# Patient Record
Sex: Male | Born: 1970 | Hispanic: Yes | Marital: Married | Smoking: Former smoker
Health system: Southern US, Community
[De-identification: ages and names within clinical notes are randomized; demographics above are authoritative.]

## PROBLEM LIST (undated history)

## (undated) DIAGNOSIS — F329 Major depressive disorder, single episode, unspecified: Secondary | ICD-10-CM

## (undated) DIAGNOSIS — H269 Unspecified cataract: Secondary | ICD-10-CM

## (undated) DIAGNOSIS — E785 Hyperlipidemia, unspecified: Secondary | ICD-10-CM

## (undated) DIAGNOSIS — E119 Type 2 diabetes mellitus without complications: Secondary | ICD-10-CM

## (undated) DIAGNOSIS — K219 Gastro-esophageal reflux disease without esophagitis: Secondary | ICD-10-CM

## (undated) DIAGNOSIS — T7840XA Allergy, unspecified, initial encounter: Secondary | ICD-10-CM

## (undated) DIAGNOSIS — H5440 Blindness, one eye, unspecified eye: Secondary | ICD-10-CM

## (undated) DIAGNOSIS — F32A Depression, unspecified: Secondary | ICD-10-CM

## (undated) HISTORY — DX: Hyperlipidemia, unspecified: E78.5

## (undated) HISTORY — DX: Gastro-esophageal reflux disease without esophagitis: K21.9

## (undated) HISTORY — DX: Allergy, unspecified, initial encounter: T78.40XA

## (undated) HISTORY — DX: Type 2 diabetes mellitus without complications: E11.9

## (undated) HISTORY — DX: Depression, unspecified: F32.A

## (undated) HISTORY — DX: Unspecified cataract: H26.9

## (undated) HISTORY — PX: EYE SURGERY: SHX253

## (undated) HISTORY — DX: Blindness, one eye, unspecified eye: H54.40

---
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
## 1999-08-25 ENCOUNTER — Emergency Department (HOSPITAL_COMMUNITY): Admission: EM | Admit: 1999-08-25 | Discharge: 1999-08-25 | Payer: Self-pay

## 2002-08-23 ENCOUNTER — Emergency Department (HOSPITAL_COMMUNITY): Admission: EM | Admit: 2002-08-23 | Discharge: 2002-08-24 | Payer: Self-pay | Admitting: Emergency Medicine

## 2002-08-24 ENCOUNTER — Encounter: Payer: Self-pay | Admitting: Emergency Medicine

## 2007-09-18 ENCOUNTER — Emergency Department (HOSPITAL_COMMUNITY): Admission: EM | Admit: 2007-09-18 | Discharge: 2007-09-18 | Payer: Self-pay | Admitting: Family Medicine

## 2007-11-17 ENCOUNTER — Emergency Department (HOSPITAL_COMMUNITY): Admission: EM | Admit: 2007-11-17 | Discharge: 2007-11-17 | Payer: Self-pay | Admitting: Emergency Medicine

## 2008-12-18 ENCOUNTER — Ambulatory Visit: Payer: Self-pay | Admitting: Family Medicine

## 2008-12-18 ENCOUNTER — Encounter: Payer: Self-pay | Admitting: Family Medicine

## 2008-12-18 DIAGNOSIS — E1165 Type 2 diabetes mellitus with hyperglycemia: Secondary | ICD-10-CM

## 2008-12-18 DIAGNOSIS — E119 Type 2 diabetes mellitus without complications: Secondary | ICD-10-CM | POA: Insufficient documentation

## 2008-12-18 DIAGNOSIS — F1010 Alcohol abuse, uncomplicated: Secondary | ICD-10-CM | POA: Insufficient documentation

## 2008-12-18 DIAGNOSIS — H11009 Unspecified pterygium of unspecified eye: Secondary | ICD-10-CM | POA: Insufficient documentation

## 2008-12-18 DIAGNOSIS — F1210 Cannabis abuse, uncomplicated: Secondary | ICD-10-CM | POA: Insufficient documentation

## 2008-12-18 DIAGNOSIS — R809 Proteinuria, unspecified: Secondary | ICD-10-CM

## 2008-12-18 DIAGNOSIS — H5440 Blindness, one eye, unspecified eye: Secondary | ICD-10-CM

## 2008-12-18 DIAGNOSIS — R112 Nausea with vomiting, unspecified: Secondary | ICD-10-CM

## 2008-12-18 LAB — CONVERTED CEMR LAB
ALT: 17 units/L (ref 0–53)
AST: 18 units/L (ref 0–37)
Albumin: 4.7 g/dL (ref 3.5–5.2)
Alkaline Phosphatase: 65 units/L (ref 39–117)
BUN: 9 mg/dL (ref 6–23)
CO2: 20 meq/L (ref 19–32)
Calcium: 9.6 mg/dL (ref 8.4–10.5)
Chloride: 102 meq/L (ref 96–112)
Cholesterol: 177 mg/dL (ref 0–200)
Creatinine, Ser: 0.82 mg/dL (ref 0.40–1.50)
Glucose, Bld: 190 mg/dL — ABNORMAL HIGH (ref 70–99)
HDL: 41 mg/dL (ref >39)
Hgb A1c MFr Bld: 7.2 %
LDL Cholesterol: 95 mg/dL (ref 0–99)
Potassium: 4.3 meq/L (ref 3.5–5.3)
Sodium: 140 meq/L (ref 135–145)
Total Bilirubin: 0.6 mg/dL (ref 0.3–1.2)
Total CHOL/HDL Ratio: 4.3
Total Protein: 7.4 g/dL (ref 6.0–8.3)
Triglycerides: 203 mg/dL — ABNORMAL HIGH (ref <150)
VLDL: 41 mg/dL — ABNORMAL HIGH (ref 0–40)

## 2008-12-23 ENCOUNTER — Telehealth: Payer: Self-pay | Admitting: *Deleted

## 2008-12-23 ENCOUNTER — Encounter: Payer: Self-pay | Admitting: Family Medicine

## 2009-01-17 ENCOUNTER — Encounter (INDEPENDENT_AMBULATORY_CARE_PROVIDER_SITE_OTHER): Payer: Self-pay | Admitting: *Deleted

## 2009-01-17 DIAGNOSIS — F17200 Nicotine dependence, unspecified, uncomplicated: Secondary | ICD-10-CM | POA: Insufficient documentation

## 2009-03-09 ENCOUNTER — Ambulatory Visit: Payer: Self-pay | Admitting: Family Medicine

## 2009-08-05 ENCOUNTER — Emergency Department (HOSPITAL_COMMUNITY): Admission: EM | Admit: 2009-08-05 | Discharge: 2009-08-05 | Payer: Self-pay | Admitting: Family Medicine

## 2010-01-01 ENCOUNTER — Observation Stay (HOSPITAL_COMMUNITY): Admission: EM | Admit: 2010-01-01 | Discharge: 2010-01-02 | Payer: Self-pay | Admitting: Emergency Medicine

## 2010-01-01 ENCOUNTER — Emergency Department (HOSPITAL_COMMUNITY): Admission: EM | Admit: 2010-01-01 | Discharge: 2010-01-01 | Payer: Self-pay | Admitting: Family Medicine

## 2010-01-04 ENCOUNTER — Emergency Department (HOSPITAL_COMMUNITY): Admission: EM | Admit: 2010-01-04 | Discharge: 2010-01-04 | Payer: Self-pay | Admitting: Emergency Medicine

## 2010-04-06 NOTE — Letter (Signed)
Summary: Results Letter  [facility]  [address]   [place], [place] [address]   Phone: [phone]  Fax: [phone]    12/23/2008  James Pratt [address] [place], [place]  [address]  Dear Mr. Birt,  Your recent labs were normal with the exception of high glucose (sugar) and high triglycerides (this is also related to sugar). Your kidneys studies indicate that your diabetes is affecting them, so I would also like for you to start a medication called Lisinopril that will protect them from further damage. Because you have diabetes and high triglycerides, I would like for you to also take a baby aspirin each day to protect your heart. We can discuss the labs and address any questions that you may have at your next visit.   Your medications: 1)  Ranitidine Hcl 150 Mg Caps (Ranitidine Hcl) .... Two Times A Day To Protect Your Stomach 2)  Metformin Hcl 1000 Mg Tabs (Metformin Hcl) .... 1/2 To 1 By Mouth Daily For Your Diabetes 3)  Aspirin 81 Mg  Tbec (Aspirin) .... One By Mouth Every Day To Protect  Your Heart 4)  Lisinopril 10 Mg  Tabs (Lisinopril) .... Take 1/2  Tab By Mouth Daily To Protect Your Kidneys  Sincerely,   Helane Rima DO   Appended Document: Results Letter mailed.

## 2010-04-06 NOTE — Assessment & Plan Note (Signed)
Summary: pt left without being seen/TS   Allergies: 1)  ! * Aleve'   Complete Medication List: 1)  Ranitidine Hcl 150 Mg Caps (Ranitidine hcl) .... Two times a day to protect your stomach 2)  Metformin Hcl 1000 Mg Tabs (Metformin hcl) .... 1/2 to 1 by mouth daily for your diabetes 3)  Aspirin 81 Mg Tbec (Aspirin) .... One by mouth every day 4)  Lisinopril 10 Mg Tabs (Lisinopril) .... Take 1/2  tab by mouth daily  Other Orders: No Charge Patient Arrived (NCPA0) (NCPA0)

## 2010-04-06 NOTE — Assessment & Plan Note (Signed)
Summary: NP,tcb   Vital Signs:  Patient profile:   40 year old male Height:      67.5 inches Weight:      180.5 pounds BMI:     27.95 Pulse rate:   80 / minute BP sitting:   130 / 80  (left arm)  Vitals Entered By: Arlyss Repress CMA, (December 18, 2008 9:03 AM) CC: new pt. was dx with DM some years ago at [facility]. does not check BS or takes meds because can not afford it. Is Patient Diabetic? Yes  Pain Assessment Patient in pain? no        Primary Care Provider:  Helane Rima DO  CC:  new pt. was dx with DM some years ago at [facility]. does not check BS or takes meds because can not afford it.Marland Kitchen  History of Present Illness: 40 yo M presenting for a new patient visit.  1. DM: dx at [facility] one year ago. was Rx metformin but did not take because he could not afford the medication. bought natural things to help with DM including an aloe drink which he used until last month. states that his sugars went from  ~500 to low 100s. he used his wife's glucometer to check. denies CP, SOB, HA, numbness/tingling in hands/feet, lower extremity edema. endorses N/V, abdominal pain, polyuria, polydipsia. he tries to follow a diabetic diet. he does not exercise.  2. Marijuana: one daily x almost 20 years.  3. ETOH: 3-4 beer nearly every day. no history of withdrawal.  4. Nausea/Vomiting/Abdominal Pain: each morning the patient wakes with nausea and mild abdominal pain, he vomits small amount of bile, then goes outside to smoke marijuana, then feels better. he has no more problems for the rest of the day. he denies hematemesis, melena, hematochezia. no abnormal weight loss. no fever/chills.   5. 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    Habits & Providers  Alcohol-Tobacco-Diet     Alcohol drinks/day: 4     Alcohol Counseling: to decrease amount and/or frequency of alcohol intake     Alcohol type: beer     Feels need to cut down: yes     Feels annoyed by  complaints: no     Feels guilty re: drinking: no     Needs 'eye opener' in am: no     Tobacco Status: current  Exercise-Depression-Behavior     Does Patient Exercise: no     Exercise Counseling: to improve exercise regimen     Have you felt down or hopeless? yes     Have you felt little pleasure in things? yes     Depression Counseling: further diagnostic testing and/or other treatment is indicated     STD Risk: never     Drug Use: marijuanna     Seat Belt Use: always  Comments: smokes 'weed'  Current Medications (verified): 1)  Ranitidine Hcl 150 Mg Caps (Ranitidine Hcl) .... Two Times A Day To Protect Your Stomach 2)  Metformin Hcl 1000 Mg Tabs (Metformin Hcl) .... 1/2 To 1 By Mouth Daily For Your Diabetes 3)  Aspirin 81 Mg  Tbec (Aspirin) .... One By Mouth Every Day 4)  Lisinopril 10 Mg  Tabs (Lisinopril) .... Take 1/2  Tab By Mouth Daily  Allergies (verified): 1)  ! * Aleve'  Past History:  Past Medical History: DM ETOH Abuse MJ Abuse Pterygium Left Eye Legally Blind Right Eye  Past Surgical History: Reconstruction of Right Globe,  2009    -after removal of 1 inch wood splinter    -followed at [facility]    -now blind in right eye  Family History: Family History Diabetes 1st degree relative Family History Hypertension  Social History: Lives in [place] with his wife and 2 daughter. Unemployed. 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 Wife: Nike Southwell. Daughter: Demetra Shiner (born in 1999). Daughter: Parks Ranger (born in 2007). Completed 5-8 years of education. Smoking Status:  current Does Patient Exercise:  no Drug Use:  marijuanna Seat Belt Use:  always STD Risk:  never  Review of Systems General:  Denies chills, fever, malaise, and weakness. Eyes:  Complains of vision loss-1 eye. CV:  Denies chest pain or discomfort, palpitations, shortness of breath with exertion, swelling of feet, and swelling of hands. Resp:  Denies  cough, shortness of breath, and wheezing. GI:  Complains of diarrhea, nausea, and vomiting; denies abdominal pain, bloody stools, dark tarry stools, indigestion, and vomiting blood. GU:  Complains of urinary frequency; denies dysuria and hematuria. Endo:  Complains of excessive thirst and excessive urination; denies weight change.  Physical Exam  General:  Well-developed, well-nourished, in no acute distress; alert, appropriate and cooperative throughout examination. Vitals reviewed. Head:  normocephalic and atraumatic.   Eyes:  left eye with hazy cornea, right eye with pterygium. Mouth:  pharynx pink and moist.   Neck:  No deformities, masses, or tenderness noted. Lungs:  Normal respiratory effort, chest expands symmetrically. Lungs are clear to auscultation, no crackles or wheezes. Heart:  Normal rate and regular rhythm. S1 and S2 normal without gallop, murmur, click, rub or other extra sounds. Abdomen:  Bowel sounds positive, abdomen soft and without masses, organomegaly or hernias noted. Slightly tender to palpation epigastric region. Pulses:  2+ dp. Extremities:  No edema. Neurologic:  alert & oriented X3, cranial nerves II-XII intact, and strength normal in all extremities.   Skin:  Intact without suspicious lesions or rashes. Psych:  Oriented X3, memory intact for recent and remote, normally interactive, and not anxious appearing.     Impression & Recommendations:  Problem # 1:  DIABETES MELLITUS (ICD-250.00) Assessment New A1c 7.2. We discussed stopping the herbs/drinks that he has been taking since it isn't regulated by the FDA and we can't be sure of contaminants. Rx: metformin to start with 500 by mouth two times a day. He was asked to hold the prescription until labs known.  His updated medication list for this problem includes:    Metformin Hcl 1000 Mg Tabs (Metformin hcl) .Marland Kitchen... 1/2 to 1 by mouth daily for your diabetes    Aspirin 81 Mg Tbec (Aspirin) ..... One by mouth  every day    Lisinopril 10 Mg Tabs (Lisinopril) .Marland Kitchen... Take 1/2  tab by mouth daily  Orders: A1C-[facility] (45409) UA Microalbumin-[facility] (81191) Comp Met-[facility] ([id number]) Lipid-[facility] ([id number])  Problem # 2:  CANNABIS ABUSE (ICD-305.20) Assessment: New Morning nausea/vomiting/diarrhea may be caused by marijuana withdrawl since the patient has been smoking it nearly daily for almost 20 years. Discussed slowly weaning off. The goal for this month is to decrease amount smoked from 1 daily to 1/2 daily.   Problem # 3:  ALCOHOL ABUSE (ICD-305.00) Assessment: New Disussed decreasing to 2 beer/daily this month.  Orders: Comp Met-[facility] [id number]) Lipid-[facility] ([id number])  Problem # 4:  NAUSEA AND VOMITING (ICD-787.01) History suggestive of mild daily withdrawal. Will Rx: ranitidine. Discussed slow wean of marijuana and ETOH. Gave red  flags. Will monitor carefully.  Problem # 5:  PTERYGIUM (ICD-372.40) Assessment: New Has optho and appointment on Nov 6.  Problem # 6:  MICROALBUMINURIA (ICD-791.0) Assessment: New Needs ACE/ARB. Awaiting creatinine.  Problem # 7:  Preventive Health Care (ICD-V70.0) Patient was asked to f/u in one month for FULL PHYSICAL. Flu shot given today.  Problem # 8:  BLINDNESS, LEFT EYE (ICD-369.60) Assessment: New Has optho and appointment on Nov. 6.  Complete Medication List: 1)  Ranitidine Hcl 150 Mg Caps (Ranitidine hcl) .... Two times a day to protect your stomach 2)  Metformin Hcl 1000 Mg Tabs (Metformin hcl) .... 1/2 to 1 by mouth daily for your diabetes 3)  Aspirin 81 Mg Tbec (Aspirin) .... One by mouth every day 4)  Lisinopril 10 Mg Tabs (Lisinopril) .... Take 1/2  tab by mouth daily  Other Orders: Influenza Vaccine NON MCR ([id number])  Patient Instructions: 1)  It was nice to meet you today! 2)  We are going to check some lab work today and I will let you know the results when they are back. 3)  Try to cut down on your smoking each day. 4)  Schedule a  FULL PHYSICAL in one month. Prescriptions: LISINOPRIL 10 MG  TABS (LISINOPRIL) Take 1/2  tab by mouth daily  #30 x 3   Entered and Authorized by:   Helane Rima DO   Signed by:   Helane Rima DO on 12/18/2008   Method used:   Print then Give to Patient   RxID:   [id number] ASPIRIN 81 MG  TBEC (ASPIRIN) one by mouth every day  #90 x 3   Entered and Authorized by:   Helane Rima DO   Signed by:   Helane Rima DO on 12/18/2008   Method used:   Print then Give to Patient   RxID:   [id number] METFORMIN HCL 1000 MG TABS (METFORMIN HCL) 1/2 to 1 by mouth daily for your diabetes  #180 x 3   Entered and Authorized by:   Helane Rima DO   Signed by:   Helane Rima DO on 12/18/2008   Method used:   Print then Give to Patient   RxID:   [id number] RANITIDINE HCL 150 MG CAPS (RANITIDINE HCL) two times a day to protect your stomach  #60 x 1   Entered and Authorized by:   Helane Rima DO   Signed by:   Helane Rima DO on 12/18/2008   Method used:   Print then Give to Patient   RxID:   [id number]    Influenza Vaccine    Vaccine Type: Fluvax Non-MCR    Site: left deltoid    Mfr: GlaxoSmithKline    Dose: 0.5 ml    Route: IM    Given by: Arlyss Repress CMA,    Exp. Date: 09/03/2009    Lot #: [id number]    VIS given: 09/28/06 version given December 18, 2008.  Flu Vaccine Consent Questions    Do you have a history of severe allergic reactions to this vaccine? no    Any prior history of allergic reactions to egg and/or gelatin? no    Do you have a sensitivity to the preservative Thimersol? no    Do you have a past history of Guillan-Barre Syndrome? no    Do you currently have an acute febrile illness? no    Have you ever had a severe reaction to latex? no    Vaccine information given and explained to patient? yes  Laboratory  Results   Urine Tests  Date/Time Received: December 18, 2008 10:02 AM  Date/Time Reported: December 18, 2008 10:24 AM   Microalbumin  (urine): 2+ mg/L   Comments: ...............test performed by......Marland KitchenBonnie A. [place], [place] (ASCP)   Blood Tests   Date/Time Received: December 18, 2008 9:08 AM  Date/Time Reported: December 18, 2008 9:28 AM   HGBA1C: 7.2%   (Normal Range: Non-Diabetic - 3-6%   Control Diabetic - 6-8%)  Comments: ...........test performed by...........Marland KitchenTerese Door, CMA       Prevention & Chronic Care Immunizations   Influenza vaccine: Fluvax Non-MCR  (12/18/2008)    Tetanus booster: Not documented    Pneumococcal vaccine: Not documented  Other Screening   Smoking status: current  (12/18/2008)  Diabetes Mellitus   HgbA1C: 7.2  (12/18/2008)    Eye exam: Not documented   Diabetic eye exam action/deferral: Not indicated  (12/18/2008)    Foot exam: Not documented   High risk foot: Not documented   Foot care education: Not documented   Foot exam due: 03/20/2009    Urine microalbumin/creatinine ratio: Not documented    Diabetes flowsheet reviewed?: Yes   Progress toward A1C goal: Unchanged  Lipids   Total Cholesterol: Not documented   LDL: Not documented   LDL Direct: Not documented   HDL: Not documented   Triglycerides: Not documented  Self-Management Support :   Personal Goals (by the next clinic visit) :     Personal A1C goal: 6  (12/18/2008)     Personal blood pressure goal: 130/80  (12/18/2008)     Personal LDL goal: 100  (12/18/2008)    Patient will work on the following items until the next clinic visit to reach self-care goals:     Medications and monitoring: take my medicines every day, check my blood sugar, check my blood pressure, bring all of my medications to every visit  (12/18/2008)     Eating: drink diet soda or water instead of juice or soda, eat more vegetables, use fresh or frozen vegetables, eat foods that are low in salt, eat baked foods instead of fried foods, eat fruit for snacks and desserts, limit or avoid alcohol  (12/18/2008)     Activity: take a 30  minute walk every day, take the stairs instead of the elevator, park at the far end of the parking lot  (12/18/2008)    Diabetes self-management support: Written self-care plan  (12/18/2008)   Diabetes care plan printed   Appended Document: Orders Update    Clinical Lists Changes  Orders: Added new Test order of [facility]- New Level 4 ([address]) - Signed

## 2010-04-10 ENCOUNTER — Encounter: Payer: Self-pay | Admitting: *Deleted

## 2010-05-19 LAB — CBC
HCT: 41.3 % (ref 39.0–52.0)
MCH: 30.4 pg (ref 26.0–34.0)
MCV: 86 fL (ref 78.0–100.0)
Platelets: 151 10*3/uL (ref 150–400)
RDW: 12.3 % (ref 11.5–15.5)

## 2010-05-19 LAB — DIFFERENTIAL
Basophils Absolute: 0 10*3/uL (ref 0.0–0.1)
Eosinophils Absolute: 0.1 10*3/uL (ref 0.0–0.7)
Eosinophils Relative: 2 % (ref 0–5)
Monocytes Absolute: 0.9 10*3/uL (ref 0.1–1.0)

## 2010-05-19 LAB — GLUCOSE, CAPILLARY
Glucose-Capillary: 312 mg/dL — ABNORMAL HIGH (ref 70–99)
Glucose-Capillary: 342 mg/dL — ABNORMAL HIGH (ref 70–99)

## 2010-05-19 LAB — POCT I-STAT, CHEM 8
BUN: 12 mg/dL (ref 6–23)
Calcium, Ion: 1.22 mmol/L (ref 1.12–1.32)
Creatinine, Ser: 0.9 mg/dL (ref 0.4–1.5)
Glucose, Bld: 300 mg/dL — ABNORMAL HIGH (ref 70–99)
TCO2: 27 mmol/L (ref 0–100)

## 2010-05-24 LAB — POCT I-STAT, CHEM 8
Chloride: 100 mEq/L (ref 96–112)
Glucose, Bld: 273 mg/dL — ABNORMAL HIGH (ref 70–99)
HCT: 45 % (ref 39.0–52.0)
Hemoglobin: 15.3 g/dL (ref 13.0–17.0)
Potassium: 4.3 mEq/L (ref 3.5–5.1)
Sodium: 136 mEq/L (ref 135–145)

## 2010-12-02 LAB — DIFFERENTIAL
Basophils Absolute: 0
Eosinophils Relative: 1
Lymphocytes Relative: 31
Lymphs Abs: 2
Monocytes Absolute: 0.4
Monocytes Relative: 7
Neutro Abs: 3.9

## 2010-12-02 LAB — COMPREHENSIVE METABOLIC PANEL
ALT: 27
AST: 21
Albumin: 4.1
Alkaline Phosphatase: 76
BUN: 15
CO2: 28
Calcium: 9.4
Chloride: 102
Creatinine, Ser: 0.83
GFR calc Af Amer: >60
GFR calc non Af Amer: >60
Glucose, Bld: 255 — ABNORMAL HIGH
Potassium: 4.3
Sodium: 138
Total Bilirubin: 1.1
Total Protein: 7.4

## 2010-12-02 LAB — URINE MICROSCOPIC-ADD ON

## 2010-12-02 LAB — KETONES, QUALITATIVE: Acetone, Bld: NEGATIVE

## 2010-12-02 LAB — CBC
HCT: 46.6
Hemoglobin: 16.4
RBC: 5.39
RDW: 12.4
WBC: 6.5

## 2010-12-02 LAB — URINALYSIS, ROUTINE W REFLEX MICROSCOPIC
Bilirubin Urine: NEGATIVE
Hgb urine dipstick: NEGATIVE
Nitrite: NEGATIVE
Protein, ur: NEGATIVE
Urobilinogen, UA: 1

## 2010-12-02 LAB — POCT I-STAT, CHEM 8
BUN: 17
Calcium, Ion: 1.23
Chloride: 101
Creatinine, Ser: 0.8
Glucose, Bld: 255 — ABNORMAL HIGH
TCO2: 26

## 2010-12-02 LAB — POCT URINALYSIS DIP (DEVICE)
Glucose, UA: 500 — AB
Nitrite: NEGATIVE
pH: 6

## 2014-02-10 ENCOUNTER — Ambulatory Visit (INDEPENDENT_AMBULATORY_CARE_PROVIDER_SITE_OTHER): Payer: 59 | Admitting: Family Medicine

## 2014-02-10 ENCOUNTER — Encounter: Payer: Self-pay | Admitting: Family Medicine

## 2014-02-10 ENCOUNTER — Ambulatory Visit (INDEPENDENT_AMBULATORY_CARE_PROVIDER_SITE_OTHER): Payer: 59 | Admitting: *Deleted

## 2014-02-10 VITALS — BP 130/75 | HR 72 | Temp 98.1°F | Ht 68.0 in | Wt 184.0 lb

## 2014-02-10 DIAGNOSIS — E1142 Type 2 diabetes mellitus with diabetic polyneuropathy: Secondary | ICD-10-CM | POA: Diagnosis not present

## 2014-02-10 DIAGNOSIS — Z23 Encounter for immunization: Secondary | ICD-10-CM

## 2014-02-10 LAB — BASIC METABOLIC PANEL
BUN: 18 mg/dL (ref 6–23)
CALCIUM: 9.8 mg/dL (ref 8.4–10.5)
CO2: 26 mEq/L (ref 19–32)
CREATININE: 0.91 mg/dL (ref 0.50–1.35)
Chloride: 98 mEq/L (ref 96–112)
Glucose, Bld: 319 mg/dL — ABNORMAL HIGH (ref 70–99)
POTASSIUM: 4.4 meq/L (ref 3.5–5.3)
Sodium: 133 mEq/L — ABNORMAL LOW (ref 135–145)

## 2014-02-10 LAB — LIPID PANEL
CHOLESTEROL: 173 mg/dL (ref 0–200)
HDL: 36 mg/dL — ABNORMAL LOW (ref >39)
LDL Cholesterol: 81 mg/dL (ref 0–99)
TRIGLYCERIDES: 278 mg/dL — AB (ref <150)
Total CHOL/HDL Ratio: 4.8 Ratio
VLDL: 56 mg/dL — ABNORMAL HIGH (ref 0–40)

## 2014-02-10 LAB — POCT GLYCOSYLATED HEMOGLOBIN (HGB A1C): Hemoglobin A1C: 9.3

## 2014-02-10 MED ORDER — NAPROXEN 500 MG PO TABS: 500.0000 mg | ORAL_TABLET | Freq: Two times a day (BID) | ORAL | Status: DC | PRN

## 2014-02-10 MED ORDER — PRAVASTATIN SODIUM 20 MG PO TABS: 20.0000 mg | ORAL_TABLET | Freq: Every day | ORAL | Status: DC

## 2014-02-10 MED ORDER — GABAPENTIN 100 MG PO CAPS: 100.0000 mg | ORAL_CAPSULE | Freq: Every day | ORAL | Status: DC

## 2014-02-10 MED ORDER — METFORMIN HCL 500 MG PO TABS: 500.0000 mg | ORAL_TABLET | Freq: Two times a day (BID) | ORAL | Status: DC

## 2014-02-10 NOTE — Patient Instructions (Signed)
We will call with the results of your lab work.

## 2014-02-10 NOTE — Progress Notes (Signed)
   Subjective:    Patient ID: James Pratt, male    DOB: March 10, 1970, 43 y.o.   MRN: [id number]  HPI CHRONIC DIABETES  Disease Monitoring  Blood Sugar Ranges: 200s fasting and evening  Polyuria: no   Visual problems: no, blind in left eye   Medication Compliance: no, taking metformin am and glipizide pm but doubt even this is consistent  Medication Side Effects  Hypoglycemia: yes, when he takes both meds (61 lowest he recalls)   Preventitive Health Care  Eye Exam: not recently, going slow with new patient but will address next visit  Foot Exam: done today  Diet pattern: irregular, busy work schedule  Exercise: active job Occupational hygienist(plumber)    Review of Systems See HPI    Objective:   Physical Exam  Constitutional: He is oriented to person, place, and time. He appears well-developed and well-nourished. No distress.  HENT:  Head: Normocephalic and atraumatic.  Eyes: Conjunctivae are normal. Right eye exhibits no discharge. Left eye exhibits no discharge. No scleral icterus.  Neck: Normal range of motion. Neck supple. No thyromegaly present.  Cardiovascular: Normal rate, regular rhythm, normal heart sounds and intact distal pulses.   No murmur heard. Pulmonary/Chest: Effort normal and breath sounds normal. No respiratory distress. He has no wheezes.  Abdominal: Soft. Bowel sounds are normal. He exhibits no distension and no mass. There is no tenderness. There is no rebound and no guarding.  Lymphadenopathy:    He has no cervical adenopathy.  Neurological: He is alert and oriented to person, place, and time.  Skin: Skin is warm and dry. He is not diaphoretic.  Psychiatric: He has a normal mood and affect. His behavior is normal.  Nursing note and vitals reviewed.         Assessment & Plan:

## 2014-02-10 NOTE — Assessment & Plan Note (Addendum)
A1c 9.3 today. patient reports hypoglycemia with metformin and glypizide together so is taking each once a day, complaining of burning in feet at night, reports severe "bone pain" from lipitor in the past - foot exam today, normal sensation, add gabapentin qhs - metformin 500 bid, will certainly need more than this but given poor compliance and contentious relationship with last MD will take it slow - bmet today - trial of low dose pravastatin to assess tolerance, lipid panel today - f/u in 3 months

## 2014-02-12 ENCOUNTER — Telehealth: Payer: Self-pay | Admitting: *Deleted

## 2014-02-12 NOTE — Telephone Encounter (Signed)
Patient wife informed of message from MD, expressed understanding.

## 2014-02-12 NOTE — Telephone Encounter (Signed)
-----   Message from Abram SanderElena M Adamo, MD sent at 02/11/2014  8:55 AM EST ----- Please inform patient that his blood sugar and cholesterol levels are elevated as we expected but otherwise his lab results are normal. He should take the pravachol and metformin as prescribed and let us know if he has any trouble tolerating these medications. I want to see him back in 2-3 months to recheck his A1c.

## 2014-05-12 ENCOUNTER — Ambulatory Visit (INDEPENDENT_AMBULATORY_CARE_PROVIDER_SITE_OTHER): Payer: 59 | Admitting: Family Medicine

## 2014-05-12 ENCOUNTER — Encounter: Payer: Self-pay | Admitting: Family Medicine

## 2014-05-12 DIAGNOSIS — IMO0002 Reserved for concepts with insufficient information to code with codable children: Secondary | ICD-10-CM

## 2014-05-12 DIAGNOSIS — E1165 Type 2 diabetes mellitus with hyperglycemia: Secondary | ICD-10-CM

## 2014-05-12 DIAGNOSIS — E119 Type 2 diabetes mellitus without complications: Secondary | ICD-10-CM

## 2014-05-12 LAB — GLUCOSE, CAPILLARY: GLUCOSE-CAPILLARY: 392 mg/dL — AB (ref 70–99)

## 2014-05-12 LAB — POCT GLYCOSYLATED HEMOGLOBIN (HGB A1C): HEMOGLOBIN A1C: 11.2

## 2014-05-12 MED ORDER — SITAGLIPTIN PHOSPHATE 100 MG PO TABS: 100.0000 mg | ORAL_TABLET | Freq: Every day | ORAL | Status: DC

## 2014-05-12 MED ORDER — GLUCOSE BLOOD VI STRP: ORAL_STRIP | Status: DC

## 2014-05-12 MED ORDER — LINAGLIPTIN 5 MG PO TABS: 5.0000 mg | ORAL_TABLET | Freq: Every day | ORAL | Status: DC

## 2014-05-12 MED ORDER — ONETOUCH ULTRA 2 W/DEVICE KIT: PACK | Status: DC

## 2014-05-12 MED ORDER — ONETOUCH ULTRASOFT LANCETS MISC: Status: DC

## 2014-05-12 NOTE — Assessment & Plan Note (Signed)
Uncontrolled. A1c 11.2 today. I discussed therapeutic options with the patient including insulin. Patient declined injectables/insulin. Will titrate up metformin to 1000 mg twice a day. I also started patient on Januvia today. Follow up closely with Dr. Richarda BladeAdamo.

## 2014-05-12 NOTE — Patient Instructions (Signed)
Titrate your metformin up to 1000 mg twice daily.   Start Januvia 100 mg daily.  Follow up closely with Dr. Richarda BladeAdamo.  Take care  Dr. Adriana Simasook

## 2014-05-12 NOTE — Addendum Note (Signed)
Addended by: Tommie SamsOOK, Jemya Depierro G on: 05/12/2014 01:34 PM   Modules accepted: Orders, Medications

## 2014-05-12 NOTE — Progress Notes (Signed)
   Subjective:    Patient ID: Fabio NeighborsHeriberto R Tesler, male    DOB: May 16, 1970, 44 y.o.   MRN: [id number]  HPI 44 year old male with a history of type 2 diabetes presents for same day appointment with complaints of elevated blood sugar.  1) DM-2  CBG's - 180's - 500's.  Recently having significantly elevated CBG's due to prednisone (12 day dose pak for allergic reaction).  Medications - Metformin 500 BID.   Compliance - Yes.   Medication side effects  Hypoglycemia: no  Review of Systems Per HPI    Objective:   Physical Exam Filed Vitals:   05/12/14 0919  BP: 122/74  Pulse: 74  Temp: 98.4 F (36.9 C)   Exam: General: well appearing, NAD. Cardiovascular: RRR. No murmurs, rubs, or gallops. Respiratory: CTAB. No rales, rhonchi, or wheeze.    Assessment & Plan:  See Problem List

## 2014-06-25 ENCOUNTER — Ambulatory Visit (INDEPENDENT_AMBULATORY_CARE_PROVIDER_SITE_OTHER): Payer: 59 | Admitting: Family Medicine

## 2014-06-25 ENCOUNTER — Encounter: Payer: Self-pay | Admitting: Family Medicine

## 2014-06-25 VITALS — BP 117/75 | HR 72 | Temp 98.2°F | Ht 68.0 in | Wt 186.3 lb

## 2014-06-25 DIAGNOSIS — E1165 Type 2 diabetes mellitus with hyperglycemia: Secondary | ICD-10-CM | POA: Diagnosis not present

## 2014-06-25 DIAGNOSIS — B351 Tinea unguium: Secondary | ICD-10-CM | POA: Insufficient documentation

## 2014-06-25 DIAGNOSIS — E7800 Pure hypercholesterolemia, unspecified: Secondary | ICD-10-CM | POA: Insufficient documentation

## 2014-06-25 DIAGNOSIS — M1990 Unspecified osteoarthritis, unspecified site: Secondary | ICD-10-CM | POA: Diagnosis not present

## 2014-06-25 DIAGNOSIS — E118 Type 2 diabetes mellitus with unspecified complications: Secondary | ICD-10-CM | POA: Diagnosis not present

## 2014-06-25 DIAGNOSIS — IMO0002 Reserved for concepts with insufficient information to code with codable children: Secondary | ICD-10-CM

## 2014-06-25 LAB — POCT GLYCOSYLATED HEMOGLOBIN (HGB A1C): HEMOGLOBIN A1C: 10.7

## 2014-06-25 MED ORDER — GLIPIZIDE 5 MG PO TABS: 5.0000 mg | ORAL_TABLET | Freq: Every day | ORAL | Status: DC

## 2014-06-25 MED ORDER — TRAMADOL HCL 50 MG PO TABS: 50.0000 mg | ORAL_TABLET | Freq: Three times a day (TID) | ORAL | Status: DC | PRN

## 2014-06-25 MED ORDER — METFORMIN HCL 500 MG PO TABS: 1000.0000 mg | ORAL_TABLET | Freq: Two times a day (BID) | ORAL | Status: DC

## 2014-06-25 MED ORDER — TERBINAFINE HCL 250 MG PO TABS: 250.0000 mg | ORAL_TABLET | Freq: Every day | ORAL | Status: DC

## 2014-06-25 MED ORDER — ATORVASTATIN CALCIUM 10 MG PO TABS: 10.0000 mg | ORAL_TABLET | Freq: Every day | ORAL | Status: DC

## 2014-06-25 NOTE — Progress Notes (Signed)
   Subjective:    Patient ID: James Pratt, male    DOB: 08-16-1970, 44 y.o.   MRN: [id number]  HPI CHRONIC DIABETES  Disease Monitoring  Blood Sugar Ranges: 170-230  Polyuria: no   Visual problems: yes, occasional blurry vision, unsure how this correlates with his blood sugars   Medication Compliance: no, ran out 2 weeks ago  Medication Side Effects  Hypoglycemia: no   Preventitive Health Care  Eye Exam: Jan 2016  Foot Exam: Dec 2015  Diet pattern: high fat and sugar but trying to do better  Exercise: minimal, works in Holiday representativeconstruction     Review of Systems See HPI    Objective:   Physical Exam  Constitutional: He is oriented to person, place, and time. He appears well-developed and well-nourished. No distress.  HENT:  Head: Normocephalic and atraumatic.  Cardiovascular: Normal rate.   Pulmonary/Chest: Effort normal.  Abdominal: He exhibits no distension.  Musculoskeletal: Normal range of motion. He exhibits no edema or tenderness.       Feet:  Neurological: He is alert and oriented to person, place, and time.  Skin: Skin is warm and dry. He is not diaphoretic.  Psychiatric: He has a normal mood and affect. His behavior is normal.  Nursing note and vitals reviewed.         Assessment & Plan:

## 2014-06-25 NOTE — Assessment & Plan Note (Signed)
Severe onychomycosis of right great toe with separation at base of nail - terbinafine daily x12 weeks

## 2014-06-25 NOTE — Assessment & Plan Note (Signed)
Muscle aches/tenderness since starting pravastatin - switch to lipitor 10mg , will increase if able to tolerate

## 2014-06-25 NOTE — Assessment & Plan Note (Signed)
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 Tylenol does not help and NSAIDS make him cough up blood. - tramadol prn

## 2014-06-25 NOTE — Patient Instructions (Signed)
For your diabetes please continue taking metformin 2 tabs twice a day and add glipizide 1 tab at lunch time.  For your muscle aches I am changing your cholesterol medicine and giving you tramadol to take as needed when the pain is very bad.  For your toenail fungus you will need to take terbinafine 1 pill daily for 12 weeks.

## 2014-06-25 NOTE — Assessment & Plan Note (Addendum)
Mild improvement in A1c from 1.5 months ago, still uncontrolled at 10.7. No meds for the last 2 weeks because he ran out. - continue metformin 1g bid - stop linagliptin (cause severe headaches and hyperglycemia per patient) - restart glipizide 5mg  daily. - f/u in 3 months

## 2014-08-01 ENCOUNTER — Ambulatory Visit: Payer: 59 | Admitting: Family Medicine

## 2014-11-12 ENCOUNTER — Other Ambulatory Visit: Payer: Self-pay | Admitting: *Deleted

## 2014-11-12 DIAGNOSIS — E7800 Pure hypercholesterolemia, unspecified: Secondary | ICD-10-CM

## 2014-11-13 MED ORDER — ATORVASTATIN CALCIUM 10 MG PO TABS: 10.0000 mg | ORAL_TABLET | Freq: Every day | ORAL | Status: DC

## 2014-11-26 ENCOUNTER — Ambulatory Visit (INDEPENDENT_AMBULATORY_CARE_PROVIDER_SITE_OTHER): Payer: 59 | Admitting: Family Medicine

## 2014-11-26 ENCOUNTER — Encounter: Payer: Self-pay | Admitting: Family Medicine

## 2014-11-26 ENCOUNTER — Ambulatory Visit (HOSPITAL_COMMUNITY)
Admission: RE | Admit: 2014-11-26 | Discharge: 2014-11-26 | Disposition: A | Discharge status: Home / Self Care | Payer: 59 | Source: Ambulatory Visit | Attending: Family Medicine | Admitting: Family Medicine

## 2014-11-26 VITALS — BP 124/83 | HR 75 | Temp 98.3°F | Ht 68.0 in | Wt 182.1 lb

## 2014-11-26 DIAGNOSIS — M25562 Pain in left knee: Secondary | ICD-10-CM | POA: Diagnosis not present

## 2014-11-26 DIAGNOSIS — M76892 Other specified enthesopathies of left lower limb, excluding foot: Secondary | ICD-10-CM | POA: Insufficient documentation

## 2014-11-26 MED ORDER — MELOXICAM 15 MG PO TABS: 15.0000 mg | ORAL_TABLET | Freq: Every day | ORAL | Status: DC

## 2014-11-26 NOTE — Patient Instructions (Signed)
Thanks for coming in today.   We will get x-rays today. Start the mobic for your pain once daily. Avoid Goody's powder if possible. If you develop andy GI bleeding, then we need to know about it, and you should stop the mobic.   You will be called with the appointment for physical therapy and the MRI.   Follow up with Dr. Richarda Blade at your next appointment.   Thanks for letting us take care of you.   Sincerely,  Devota Pace, MD Family Medicine - PGY 2

## 2014-11-28 NOTE — Progress Notes (Signed)
Patient ID: James Pratt, male   DOB: [birth year], 44 y.o.   MRN: [id number]   [facility] Aquilla Hacker, MD Phone: [phone]  Subjective:   # Left Knee Pain - Pt. Here with left knee pain that has been going on for some time, but has been worse over the past 2-3 weeks. Unable to get in to see his PCP, so opted for same day clinic.  - He says his pain has been insidious in onset, but is worse in his left knee. 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  - He says that his knee rarely swells.  - he has been using nsaid's to control his knee pain even though he is not supposed to due to a previous ?GI bleed.  - he uses goody powders intermittently when it is severe, and this reduces his pain.  - he has never injured the knee in sports, mvc, or any other incident.  - he says that it hurts worse on the medial side.  - he feels catching sometimes.  - he says that it is definitely worse when he is on his knees at work.  - he has the hardest time in the morning, and if he rests for any period of time, getting going is painful for him.  - he has not noticed any giving way, warmth, redness. He has no history of gout.   All relevant systems were reviewed and were negative unless otherwise noted in the HPI  Past Medical History Reviewed problem list.  Medications- reviewed and updated Current Outpatient Prescriptions  Medication Sig Dispense Refill  . atorvastatin (LIPITOR) 10 MG tablet Take 1 tablet (10 mg total) by mouth daily. 30 tablet 3  . Blood Glucose Monitoring Suppl (ONE TOUCH ULTRA 2) W/DEVICE KIT Use as indicated to take blood sugar. 1 each 0  . gabapentin (NEURONTIN) 100 MG capsule Take 1 capsule (100 mg total) by mouth at bedtime. 30 capsule 11  . glipiZIDE (GLUCOTROL) 5 MG tablet Take 1 tablet (5 mg total) by mouth daily before lunch. 30 tablet 3  . glucose blood (ONE TOUCH ULTRA TEST) test strip Use as instructed 100 each 12  .  Lancets (ONETOUCH ULTRASOFT) lancets Use as instructed 100 each 12  . meloxicam (MOBIC) 15 MG tablet Take 1 tablet (15 mg total) by mouth daily. 30 tablet 3  . metFORMIN (GLUCOPHAGE) 500 MG tablet Take 2 tablets (1,000 mg total) by mouth 2 (two) times daily with a meal. 120 tablet 11  . terbinafine (LAMISIL) 250 MG tablet Take 1 tablet (250 mg total) by mouth daily. 30 tablet 2  . traMADol (ULTRAM) 50 MG tablet Take 1 tablet (50 mg total) by mouth every 8 (eight) hours as needed. 90 tablet 3   No current facility-administered medications for this visit.   Chief complaint-noted No additions to family history Social history- patient is a non smoker  Objective: BP 124/83 mmHg  Pulse 75  Temp(Src) 98.3 F (36.8 C) (Oral)  Ht _0  (1.727 m)  Wt 182 lb 1.6 oz (82.6 kg)  BMI 27.69 kg/m2 Gen: NAD, alert, cooperative with exam HEENT: NCAT, EOMI, PERRL, TMs nml Neck: FROM, supple CV: RRR, good S1/S2, no murmur, cap refill <3 Resp: CTABL, no wheezes, non-labored Abd: SNTND, BS present, no guarding or organomegaly Ext: No edema, warm, normal tone, moves UE/LE spontaneously Left Knee:  - medial joint line tenderness - no effusion - no  crepitus to exam.  - no laxity to varus / valgus stress - lachman / AP drawer negative.  - positive Thessalay test, and McMurray's for "catching".  Neuro: Alert and oriented, No gross deficits Skin: no rashes no lesions  Assessment/Plan:  # Left Knee Pain - pt. With ongoing left knee pain. Given his job, and the symptoms he complains of, may be some arthritis, but exam is consistent with possible meniscus tear. No previous films. Conservative management for now.  - Mobic once daily with the instruction that he should stop and let us know if he develops bleeding / gi symptoms. ? Hx of GI bleed.  - MRI of the knee given his exam, and plain films.  - PT referral.  - follow up in 1 months with PCP for improvement.  - If not improving, and if MRI positive for  meniscal injury as I suspect it may be, may require referral to ortho.

## 2014-12-01 ENCOUNTER — Encounter: Payer: Self-pay | Admitting: Physical Therapy

## 2014-12-01 ENCOUNTER — Ambulatory Visit: Payer: 59 | Attending: Family Medicine | Admitting: Physical Therapy

## 2014-12-01 DIAGNOSIS — M25562 Pain in left knee: Secondary | ICD-10-CM | POA: Diagnosis not present

## 2014-12-01 NOTE — Therapy (Signed)
[facility] [address] [place], [place], [address] Phone: [phone]   Fax:  [phone]  Physical Therapy Evaluation  Patient Details  Name: James Pratt MRN: [id number] Date of Birth: 1970-07-20 Referring Provider:  Yolande Jolly, MD  Encounter Date: 12/01/2014      PT End of Session - 12/01/14 0844    Visit Number 1   Number of Visits 12   Date for PT Re-Evaluation 01/12/15   PT Start Time 0800   PT Stop Time 0845   PT Time Calculation (min) 45 min   Activity Tolerance Patient tolerated treatment well   Behavior During Therapy [facility] for tasks assessed/performed      Past Medical History  Diagnosis Date  . Diabetes mellitus without complication     No past surgical history on file.  There were no vitals filed for this visit.  Visit Diagnosis:  Left knee pain - Plan: PT plan of care cert/re-cert      Subjective Assessment - 12/01/14 0812    Subjective pt is a 44 y.o M with CC of L knee pain that has been going on for a while, and has gradually worsened and the pain started insidiously and the pain stays around the knee cap. pt reports the knee feels unstedy. pt reports wearing knee pads constantly to keep it warm and to cushion in with kneeling.    Limitations Walking   How long can you sit comfortably? unlimited   How long can you stand comfortably? 5-10 min   How long can you walk comfortably? 5-10 min   Diagnostic tests 9/21//2016 Minimal tricompartmental spurring   Patient Stated Goals To be pain free, and to be able to progress on my own   Currently in Pain? Yes   Pain Score 4   walking up the steps 9/10   Pain Location Knee   Pain Orientation Left   Pain Descriptors / Indicators Aching;Dull   Pain Type Chronic pain   Pain Onset More than a month ago   Pain Frequency Constant   Aggravating Factors  stairs, prolonged waking   Pain Relieving Factors resting, pain medication             [facility] PT Assessment - 12/01/14 0819    Assessment   Medical Diagnosis Left knee pain    Onset Date/Surgical Date --  3 months   Hand Dominance Right   Next MD Visit make on as needed   Prior Therapy no   Precautions   Precautions None   Restrictions   Weight Bearing Restrictions No   Balance Screen   Has the patient fallen in the past 6 months No   Has the patient had a decrease in activity level because of a fear of falling?  No   Is the patient reluctant to leave their home because of a fear of falling?  No   Home Environment   Living Environment Private residence   Living Arrangements Spouse/significant other;Children   Available Help at Discharge Available PRN/intermittently;Available 24 hours/day   Type of Home House   Home Access Ramped entrance   Home Layout One level   Prior Function   Level of Independence Independent;Independent with basic ADLs   Vocation Full time employment  Plumbing   Vocation Requirements prolonged walking, standing, craweling,    Leisure hanging out with kids   Observation/Other Assessments   Focus on Therapeutic Outcomes (FOTO)  58% Limited  Predicted 37% limited   Posture/Postural  Control   Posture/Postural Control Postural limitations   Postural Limitations Rounded Shoulders;Forward head   ROM / Strength   AROM / PROM / Strength AROM;PROM;Strength   AROM   AROM Assessment Site Knee   Right/Left Knee Right;Left   Right Knee Extension 140   Right Knee Flexion 0   Left Knee Extension 0   Left Knee Flexion 135  tightness at endrange   PROM   Overall PROM  Within functional limits for tasks performed   PROM Assessment Site Knee   Strength   Strength Assessment Site Knee   Right/Left Knee Right;Left   Right Knee Flexion 5/5   Right Knee Extension 5/5   Left Knee Flexion 4+/5  pain during testing   Left Knee Extension 4+/5  pain during testing   Palpation   Patella mobility hypermobility of the patella on the L   Palpation  comment tenderness peripatellar with increased soreness in the medial apsect of the L knee in the medial patellar retinaculum   Special Tests    Special Tests Knee Special Tests   Knee Special tests  Lateral Pull Sign;Patellofemoral Apprehension Test;Step-up/Step Down Test;Patellofemoral Grind Test (Clarke's Sign)   Lateral Pull Sign    Findings Negative   Patellofemoral Apprehension Test    Findings Negative   Side  --  bil   Step-up/Step Down    Findings Positive   Side  Left   Patellofemoral Grind test (Clark's Sign)   Findings Negative   Ambulation/Gait   Gait Pattern Step-through pattern;Antalgic                           PT Education - 12/01/14 0844    Education provided Yes   Education Details evaluation findings, POC, Goals, HEP, knee anatomy education   Person(s) Educated Patient   Methods Explanation   Comprehension Verbalized understanding          PT Short Term Goals - 12/01/14 1132    PT SHORT TERM GOAL #1   Title pt will be I with initial HEP (12/22/2014)   Time 3   Period Weeks   Status New   PT SHORT TERM GOAL #2   Title pt will demonstrate techniques to control L knee pain and inflammation via RICE and HEP (12/22/2014)   Time 3   Period Weeks   Status New           PT Long Term Goals - 12/01/14 1133    PT LONG TERM GOAL #1   Title pt will be i with all HEP given through therapy (01/12/2015)   Time 6   Period Weeks   Status New   PT LONG TERM GOAL #2   Title pt will demonstrate L knee strength to >4+/5 with < 1/10 pain during testing to assist with work related standing endurance (01/12/2015)   Time 6   Period Weeks   Status New   PT LONG TERM GOAL #3   Title pt will be able to tolerate navigating up/down > 15 steps with < 2/10 pain to assist with job requirment of climbing and descending stairs (01/12/2015)   Time 6   Period Weeks   Status New   PT LONG TERM GOAL #4   Title pt will increase his FOTO score to > 63 to  demonstrate improved function at discharge (01/12/2015)   Time 6   Period Weeks   Status New  Plan - 12/01/14 0845    Clinical Impression Statement James Pratt presents to OPPT with CC of L knee pain that has been going on for the last couple of months that has gradually worsened. He demonstrates full AROM with tightness and pain noted at end range of flexion. MMT was [facility] but demontrated pain during testing beneath the knee cap. He exhibits a hypermobil patellar with pain noted peripatellar with signicant pointendnerness along the medial joint line/ patellar retinaculum. He demontsrates crepetus in the knee during deep knee bends. Pt would would benefit from physical therapy to decreased pain and return to his PLOF by addressing the impairments listed.    Pt will benefit from skilled therapeutic intervention in order to improve on the following deficits Decreased activity tolerance;Decreased balance;Decreased endurance;Pain;Improper body mechanics;Postural dysfunction;Increased muscle spasms;Difficulty walking;Hypermobility   Rehab Potential Good   PT Frequency 2x / week   PT Duration 6 weeks   PT Treatment/Interventions ADLs/Self Care Home Management;Cryotherapy;Electrical Stimulation;Iontophoresis /ml Dexamethasone;Moist Heat;Ultrasound;Gait training;Therapeutic activities;Therapeutic exercise;Patient/family education;Manual techniques;Passive range of motion;Dry needling;Taping   PT Next Visit Plan assess response to HEP, manual for muscle tightness, assess hip strength, modalities PRN   PT Home Exercise Plan SLR, hamstring/ hip flexor stretching, LAQ   Consulted and Agree with Plan of Care Patient         Problem List Patient Active Problem List   Diagnosis Date Noted  . Hypercholesteremia 06/25/2014  . Arthritis 06/25/2014  . Onychomycosis 06/25/2014  . TOBACCO USER 01/17/2009  . Diabetes mellitus type II, uncontrolled 12/18/2008  . ALCOHOL ABUSE 12/18/2008  .  CANNABIS ABUSE 12/18/2008  . BLINDNESS, LEFT EYE 12/18/2008  . PTERYGIUM 12/18/2008  . MICROALBUMINURIA 12/18/2008   Lulu Riding PT, DPT, LAT, ATC  12/01/2014  11:42 AM      [facility] [address] [place], [place], [address] Phone: [phone]   Fax:  [phone]

## 2014-12-01 NOTE — Patient Instructions (Signed)
   Kristoffer Leamon PT, DPT, LAT, ATC  [facility] Phone: [phone]     

## 2014-12-03 ENCOUNTER — Telehealth: Payer: Self-pay | Admitting: Family Medicine

## 2014-12-03 ENCOUNTER — Encounter (HOSPITAL_COMMUNITY): Payer: Self-pay | Admitting: *Deleted

## 2014-12-03 ENCOUNTER — Emergency Department (HOSPITAL_COMMUNITY): Payer: 59

## 2014-12-03 ENCOUNTER — Emergency Department (HOSPITAL_COMMUNITY)
Admission: EM | Admit: 2014-12-03 | Discharge: 2014-12-03 | Disposition: A | Discharge status: Home / Self Care | Payer: 59 | Attending: Emergency Medicine | Admitting: Emergency Medicine

## 2014-12-03 DIAGNOSIS — M542 Cervicalgia: Secondary | ICD-10-CM | POA: Diagnosis not present

## 2014-12-03 DIAGNOSIS — R51 Headache: Secondary | ICD-10-CM | POA: Insufficient documentation

## 2014-12-03 DIAGNOSIS — R1030 Lower abdominal pain, unspecified: Secondary | ICD-10-CM | POA: Insufficient documentation

## 2014-12-03 DIAGNOSIS — E119 Type 2 diabetes mellitus without complications: Secondary | ICD-10-CM | POA: Diagnosis not present

## 2014-12-03 DIAGNOSIS — Z791 Long term (current) use of non-steroidal anti-inflammatories (NSAID): Secondary | ICD-10-CM | POA: Diagnosis not present

## 2014-12-03 DIAGNOSIS — Z79899 Other long term (current) drug therapy: Secondary | ICD-10-CM | POA: Diagnosis not present

## 2014-12-03 DIAGNOSIS — R350 Frequency of micturition: Secondary | ICD-10-CM | POA: Diagnosis not present

## 2014-12-03 DIAGNOSIS — H538 Other visual disturbances: Secondary | ICD-10-CM | POA: Insufficient documentation

## 2014-12-03 DIAGNOSIS — R519 Headache, unspecified: Secondary | ICD-10-CM

## 2014-12-03 DIAGNOSIS — G8929 Other chronic pain: Secondary | ICD-10-CM | POA: Insufficient documentation

## 2014-12-03 LAB — CBC WITH DIFFERENTIAL/PLATELET
BASOS ABS: 0 10*3/uL (ref 0.0–0.1)
BASOS PCT: 0 %
EOS PCT: 2 %
Eosinophils Absolute: 0.1 10*3/uL (ref 0.0–0.7)
HCT: 40.6 % (ref 39.0–52.0)
Hemoglobin: 14.4 g/dL (ref 13.0–17.0)
LYMPHS PCT: 34 %
Lymphs Abs: 2 10*3/uL (ref 0.7–4.0)
MCH: 29.9 pg (ref 26.0–34.0)
MCHC: 35.5 g/dL (ref 30.0–36.0)
MCV: 84.2 fL (ref 78.0–100.0)
MONO ABS: 0.4 10*3/uL (ref 0.1–1.0)
Monocytes Relative: 7 %
Neutro Abs: 3.3 10*3/uL (ref 1.7–7.7)
Neutrophils Relative %: 57 %
PLATELETS: 136 10*3/uL — AB (ref 150–400)
RBC: 4.82 MIL/uL (ref 4.22–5.81)
RDW: 12.3 % (ref 11.5–15.5)
WBC: 5.9 10*3/uL (ref 4.0–10.5)

## 2014-12-03 LAB — COMPREHENSIVE METABOLIC PANEL
ALBUMIN: 3.8 g/dL (ref 3.5–5.0)
ALT: 18 U/L (ref 17–63)
ANION GAP: 7 (ref 5–15)
AST: 27 U/L (ref 15–41)
Alkaline Phosphatase: 71 U/L (ref 38–126)
BILIRUBIN TOTAL: 0.8 mg/dL (ref 0.3–1.2)
BUN: 12 mg/dL (ref 6–20)
CHLORIDE: 105 mmol/L (ref 101–111)
CO2: 24 mmol/L (ref 22–32)
Calcium: 9.2 mg/dL (ref 8.9–10.3)
Creatinine, Ser: 0.82 mg/dL (ref 0.61–1.24)
GFR calc Af Amer: >60 mL/min (ref >60)
GFR calc non Af Amer: >60 mL/min (ref >60)
GLUCOSE: 236 mg/dL — AB (ref 65–99)
POTASSIUM: 4.7 mmol/L (ref 3.5–5.1)
SODIUM: 136 mmol/L (ref 135–145)
TOTAL PROTEIN: 6.3 g/dL — AB (ref 6.5–8.1)

## 2014-12-03 LAB — URINE MICROSCOPIC-ADD ON

## 2014-12-03 LAB — URINALYSIS, ROUTINE W REFLEX MICROSCOPIC
BILIRUBIN URINE: NEGATIVE
Glucose, UA: >1000 mg/dL — AB
Hgb urine dipstick: NEGATIVE
KETONES UR: NEGATIVE mg/dL
LEUKOCYTES UA: NEGATIVE
NITRITE: NEGATIVE
PROTEIN: NEGATIVE mg/dL
Specific Gravity, Urine: 1.012 (ref 1.005–1.030)
UROBILINOGEN UA: 0.2 mg/dL (ref 0.0–1.0)
pH: 6 (ref 5.0–8.0)

## 2014-12-03 MED ORDER — DEXAMETHASONE SODIUM PHOSPHATE 4 MG/ML IJ SOLN
2.0000 mg | Freq: Once | INTRAMUSCULAR | Status: AC
  Administered 2014-12-03: 2 mg via INTRAVENOUS
  Filled 2014-12-03: qty 1

## 2014-12-03 MED ORDER — SODIUM CHLORIDE 0.9 % IV SOLN
1000.0000 mL | Freq: Once | INTRAVENOUS | Status: AC
  Administered 2014-12-03: 1000 mL via INTRAVENOUS

## 2014-12-03 MED ORDER — METOCLOPRAMIDE HCL 5 MG/ML IJ SOLN
5.0000 mg | Freq: Once | INTRAMUSCULAR | Status: AC
  Administered 2014-12-03: 5 mg via INTRAVENOUS
  Filled 2014-12-03: qty 2

## 2014-12-03 MED ORDER — DIPHENHYDRAMINE HCL 50 MG/ML IJ SOLN
50.0000 mg | Freq: Once | INTRAMUSCULAR | Status: AC
  Administered 2014-12-03: 50 mg via INTRAVENOUS
  Filled 2014-12-03: qty 1

## 2014-12-03 MED ORDER — SODIUM CHLORIDE 0.9 % IV SOLN: 1000.0000 mL | INTRAVENOUS | Status: DC

## 2014-12-03 NOTE — ED Notes (Signed)
Pt given urinal and informed that UA is needed.

## 2014-12-03 NOTE — Discharge Instructions (Signed)
General Headache Without Cause  A general headache is pain or discomfort felt around the head or neck area. The cause may not be found.   HOME CARE   · Keep all doctor visits.  · Only take medicines as told by your doctor.  · Lie down in a dark, quiet room when you have a headache.  · Keep a journal to find out if certain things bring on headaches. For example, write down:  ¨ What you eat and drink.  ¨ How much sleep you get.  ¨ Any change to your diet or medicines.  · Relax by getting a massage or doing other relaxing activities.  · Put ice or heat packs on the head and neck area as told by your doctor.  · Lessen stress.  · Sit up straight. Do not tighten (tense) your muscles.  · Quit smoking if you smoke.  · Lessen how much alcohol you drink.  · Lessen how much caffeine you drink, or stop drinking caffeine.  · Eat and sleep on a regular schedule.  · Get 7 to 9 hours of sleep, or as told by your doctor.  · Keep lights dim if bright lights bother you or make your headaches worse.  GET HELP RIGHT AWAY IF:   · Your headache becomes really bad.  · You have a fever.  · You have a stiff neck.  · You have trouble seeing.  · Your muscles are weak, or you lose muscle control.  · You lose your balance or have trouble walking.  · You feel like you will pass out (faint), or you pass out.  · You have really bad symptoms that are different than your first symptoms.  · You have problems with the medicines given to you by your doctor.  · Your medicines do not work.  · Your headache feels different than the other headaches.  · You feel sick to your stomach (nauseous) or throw up (vomit).  MAKE SURE YOU:   · Understand these instructions.  · Will watch your condition.  · Will get help right away if you are not doing well or get worse.  Document Released: 12/01/2007 Document Revised: 05/16/2011 Document Reviewed: 02/11/2011  ExitCare® Patient Information ©2015 [employer]. This information is not intended to replace advice given to  you by your health care provider. Make sure you discuss any questions you have with your health care provider.

## 2014-12-03 NOTE — ED Notes (Signed)
Pt reports headache x 2 weeks, more severe since Saturday. Reports possible blurred vision but denies n/v or sensitivity to light. No acute distress noted at triage.

## 2014-12-03 NOTE — ED Provider Notes (Signed)
CSN: [id number]     Arrival date & time 12/03/14  0919 History   First MD Initiated Contact with Patient 12/03/14 1102     Chief Complaint  Patient presents with  . Headache     (Consider location/radiation/quality/duration/timing/severity/associated sxs/prior Treatment) Patient is a 44 y.o. male presenting with headaches.  Headache Pain location:  Occipital (left occiptal that radiates to left eye) Quality:  Dull Radiates to:  Eyes Onset quality:  Sudden Duration:  6 weeks (intermittently for 6 weeks with increasing frequency and severity.  Current headache started suddenly on Saturday lying in bed. ) Timing:  Intermittent (but increasing infrequency) Progression:  Worsening Chronicity:  Chronic Similar to prior headaches: no   Context: not activity, not exposure to bright light, not loud noise and not straining   Relieved by:  Nothing Worsened by:  Nothing Ineffective treatments:  Acetaminophen, cold packs, aspirin and NSAIDs Associated symptoms: blurred vision and neck pain   Associated symptoms: no back pain, no cough, no dizziness, no eye pain, no fever, no focal weakness, no loss of balance, no nausea, no near-syncope, no neck stiffness, no numbness, no photophobia, no seizures, no syncope, no URI, no visual change, no vomiting and no weakness     Past Medical History  Diagnosis Date  . Diabetes mellitus without complication    History reviewed. No pertinent past surgical history. History reviewed. No pertinent family history. Social History  Substance Use Topics  . Smoking status: Never Smoker   . Smokeless tobacco: None  . Alcohol Use: No    Review of Systems  Constitutional: Negative for fever.  Eyes: Positive for blurred vision. Negative for photophobia and pain.  Respiratory: Negative for cough.   Cardiovascular: Negative for syncope and near-syncope.  Gastrointestinal: Negative for nausea and vomiting.  Genitourinary: Positive for frequency.   Musculoskeletal: Positive for neck pain. Negative for back pain and neck stiffness.  Neurological: Positive for headaches. Negative for dizziness, focal weakness, seizures, weakness, numbness and loss of balance.      Allergies  Review of patient's allergies indicates no known allergies.  Home Medications   Prior to Admission medications   Medication Sig Start Date End Date Taking? Authorizing Provider  acetaminophen (TYLENOL) 500 MG tablet Take 1,000 mg by mouth every 6 (six) hours as needed for moderate pain.   Yes Historical Provider, MD  atorvastatin (LIPITOR) 10 MG tablet Take 1 tablet (10 mg total) by mouth daily. 11/13/14  Yes Abram Sander, MD  Ibuprofen (ADVIL MIGRAINE) 200 MG CAPS Take 400 mg by mouth once.   Yes Historical Provider, MD  ibuprofen (ADVIL,MOTRIN) 200 MG tablet Take 800 mg by mouth every 6 (six) hours as needed for moderate pain.   Yes Historical Provider, MD  meloxicam (MOBIC) 15 MG tablet Take 1 tablet (15 mg total) by mouth daily. 11/26/14  Yes Yolande Jolly, MD  metFORMIN (GLUCOPHAGE) 500 MG tablet Take 2 tablets (1,000 mg total) by mouth 2 (two) times daily with a meal. Patient taking differently: Take 500 mg by mouth 2 (two) times daily with a meal.  06/25/14  Yes Abram Sander, MD  gabapentin (NEURONTIN) 100 MG capsule Take 1 capsule (100 mg total) by mouth at bedtime. Patient not taking: Reported on 12/01/2014 02/10/14   Abram Sander, MD  glipiZIDE (GLUCOTROL) 5 MG tablet Take 1 tablet (5 mg total) by mouth daily before lunch. Patient not taking: Reported on 12/01/2014 06/25/14   Abram Sander, MD  terbinafine (LAMISIL) 250 MG  tablet Take 1 tablet (250 mg total) by mouth daily. Patient not taking: Reported on 12/01/2014 06/25/14   Abram Sander, MD  traMADol (ULTRAM) 50 MG tablet Take 1 tablet (50 mg total) by mouth every 8 (eight) hours as needed. Patient not taking: Reported on 12/01/2014 06/25/14   Abram Sander, MD   BP 123/73 mmHg  Pulse 52  Temp(Src)  98.1 F (36.7 C) (Oral)  Resp 16  SpO2 99% Physical Exam  Constitutional: He is oriented to person, place, and time. He appears well-developed and well-nourished.  HENT:  Head: Normocephalic and atraumatic.  Mouth/Throat: Oropharynx is clear and moist.  No nuchal rigidity.  Eyes: Pupils are equal, round, and reactive to light.  Neck: Normal range of motion. Neck supple.  Cardiovascular: Normal rate, regular rhythm and normal heart sounds.   No murmur heard. Pulmonary/Chest: Effort normal and breath sounds normal. No respiratory distress. He has no wheezes. He has no rales.  Abdominal: Soft. Bowel sounds are normal. He exhibits no distension. There is tenderness in the suprapubic area. There is no rigidity, no rebound and no guarding.  Musculoskeletal: Normal range of motion.  Lymphadenopathy:    He has no cervical adenopathy.  Neurological: He is alert and oriented to person, place, and time.  Mental Status:   AOx3 Cranial Nerves:  I-not tested  II-PERRLA  III, IV, VI-EOMs intact  V-temporal and masseter strength intact  VII-symmetrical facial movements intact, no facial droop  VIII-hearing grossly intact bilaterally  IX, X-gag intact  XI-strength of sternomastoid and trapezius muscles 5/5  XII-tongue midline Motor:   Good muscle bulk and tone  Strength 5/5 bilaterally in upper and lower extremities   Cerebellar--RAMs, finger to nose intact  Casual and tandem gait normal without ataxia  No pronator drift Sensory:  Intact in upper and lower extremities      Skin: Skin is warm and dry.  Psychiatric: He has a normal mood and affect. His behavior is normal.    ED Course  Procedures (including critical care time) Labs Review Labs Reviewed  CBC WITH DIFFERENTIAL/PLATELET - Abnormal; Notable for the following:    Platelets 136 (*)    All other components within normal limits  URINALYSIS, ROUTINE W REFLEX MICROSCOPIC (NOT AT [facility]) - Abnormal; Notable for the following:     Glucose, UA >1000 (*)    All other components within normal limits  COMPREHENSIVE METABOLIC PANEL - Abnormal; Notable for the following:    Glucose, Bld 236 (*)    Total Protein 6.3 (*)    All other components within normal limits  URINE MICROSCOPIC-ADD ON    Imaging Review Ct Head Wo Contrast  12/03/2014   CLINICAL DATA:  Headache for 2 weeks which has worsened over the past 5 days. Blurred vision. Initial encounter.  EXAM: CT HEAD WITHOUT CONTRAST  TECHNIQUE: Contiguous axial images were obtained from the base of the skull through the vertex without intravenous contrast.  COMPARISON:  None.  FINDINGS: The brain appears normal without hemorrhage, infarct, mass lesion, mass effect, midline shift or abnormal extra-axial fluid collection. There is no hydrocephalus or pneumocephalus. The calvarium is intact. Imaged paranasal sinuses and mastoid air cells are clear. Remote right nasal bone fracture noted.  IMPRESSION: No acute intracranial abnormality.  Remote right nasal bone fracture.   Electronically Signed   By: Drusilla Kanner M.D.   On: 12/03/2014 12:12   I have personally reviewed and evaluated these images and lab results as part of my medical  decision-making.   EKG Interpretation None      MDM   Final diagnoses:  Nonintractable headache, unspecified chronicity pattern, unspecified headache type    Patient with no prior headache history presents today with 6 week history of headaches that have gradually gotten worse and increased in frequency. Latest headache beginning suddenly on Saturday. No relief with OTC treatments. Patient appears in no acute distress, nontoxic and VSS. On exam, there are no focal neurological findings. No nuchal rigidity. Labs pending. Will start IV fluids and treat with migraine cocktail and reassess.  Upon reassessment, patient's headache has improved.  Head CT shows no acute intracranial abnormalities.  CBC unremarkable.  CMP shows elevated glucose,  patient has history of DM.  UA pending.  UA shows glycosuria, consistent with DM.  Suggests better glycemic control.  HA resolution with migraine cocktail.  Doubt meningitis. Doubt SAH.  Doubt mass lesion.  Doubt infection.  Doubt metabolic derangement.  VSS.  Pt stable for discharge.  Discussed return precautions.  Patient agrees and acknowledges the above plan for discharge.  PCP follow up for headache management and glycemic control.  Case has been discussed with and seen by Dr. Jeraldine Loots who agrees with the above plan for discharge.      Cheri Fowler, PA-C 12/03/14 1325  Gerhard Munch, MD 12/03/14 [phone]

## 2014-12-03 NOTE — Telephone Encounter (Signed)
Got in touch with them, they will follow up as needed. Getting MRI.   CGM MD

## 2014-12-03 NOTE — Telephone Encounter (Signed)
Attempted to call pt. With normal knee x-ray results. No answer. Please call to let her know the x-ray was normal, and to follow up as specified in our office visit. Thanks  CGM MD

## 2014-12-04 ENCOUNTER — Ambulatory Visit: Payer: 59 | Admitting: Physical Therapy

## 2014-12-04 DIAGNOSIS — M25562 Pain in left knee: Secondary | ICD-10-CM | POA: Diagnosis not present

## 2014-12-04 NOTE — Patient Instructions (Signed)
Remove tape if irritating 

## 2014-12-04 NOTE — Therapy (Addendum)
[place], [place], [address] Phone: [phone]   Fax:  [phone]  Physical Therapy Treatment  Patient Details  Name: James Pratt MRN: [id number] Date of Birth: [birth year] Referring Provider:  Frazier Richards, MD  Encounter Date: 12/04/2014      PT End of Session - 12/04/14 1635    Visit Number 2   Number of Visits 12   Date for PT Re-Evaluation 01/12/15   PT Start Time 4742   PT Stop Time 1630   PT Time Calculation (min) 45 min   Activity Tolerance Patient tolerated treatment well;No increased pain   Behavior During Therapy [facility] for tasks assessed/performed      Past Medical History  Diagnosis Date  . Diabetes mellitus without complication     No past surgical history on file.  There were no vitals filed for this visit.  Visit Diagnosis:  Left knee pain      Subjective Assessment - 12/04/14 1556    Subjective Stairs and walking in the parkinglot painful .  5/10 now  Does his exercises every other day.    Pain gets worse climbing stairs .  Better with knee pads , heat helps.                         [facility] PT Treatment/Exercise - 12/04/14 1605    Knee/Hip Exercises: Stretches   Passive Hamstring Stretch 3 reps;30 seconds  strap   Hip Flexor Stretch 3 reps;30 seconds   Gastroc Stretch 3 reps;30 seconds   Knee/Hip Exercises: Standing   Heel Raises 10 reps;2 sets   Wall Squat 10 reps   Wall Squat Limitations cues topress heels vs toes.    Knee/Hip Exercises: Seated   Long Arc Quad 10 reps;3 sets   Knee/Hip Exercises: Supine   Straight Leg Raises 10 reps;3 sets  cues   Manual Therapy   Manual therapy comments Kinesiotex taping to activate quads and inhibit lower leg.  LT                  PT Short Term Goals - 12/04/14 1643    PT SHORT TERM GOAL #1   Title pt will be I with initial HEP (12/22/2014)   Time 3   Period Weeks   Status On-going    PT SHORT TERM GOAL #2   Title pt will demonstrate techniques to control L knee pain and inflammation via RICE and HEP (12/22/2014)   Time 3   Period Weeks   Status On-going           PT Long Term Goals - 12/04/14 1642    PT LONG TERM GOAL #1   Title pt will be i with all HEP given through therapy (01/12/2015)   Baseline independent with exercises so far   Time 6   Period Weeks   Status On-going   PT LONG TERM GOAL #2   Title pt will demonstrate L knee strength to >4+/5 with < 1/10 pain during testing to assist with work related standing endurance (01/12/2015)   Baseline 5+/10 pain with work activities, strength not tested   Time 6   Period Weeks   Status On-going   PT LONG TERM GOAL #3   Title pt will be able to tolerate navigating up/down > 15 steps with < 2/10 pain to assist with job requirment of climbing and descending stairs (01/12/2015)   Time 6  Period Weeks   Status On-going   PT LONG TERM GOAL #4   Title pt will increase his FOTO score to > 63 to demonstrate improved function at discharge (01/12/2015)   Time 6   Period Weeks   Status Unable to assess               Plan - 12/04/14 1636    Clinical Impression Statement tape helpful.  patient doing his exercises with correct technique.  hip flexors and hamstrings are WNL LT   PT Next Visit Plan assess hip strength.  manual for muscle tightness, progress home  exercises for hip if weak.  RICE INFO.   PT Home Exercise Plan SLR, hamstring/ hip flexor stretching, LAQ, nothing added today.     Consulted and Agree with Plan of Care Patient        Problem List Patient Active Problem List   Diagnosis Date Noted  . Hypercholesteremia 06/25/2014  . Arthritis 06/25/2014  . Onychomycosis 06/25/2014  . TOBACCO USER 01/17/2009  . Diabetes mellitus type II, uncontrolled 12/18/2008  . ALCOHOL ABUSE 12/18/2008  . CANNABIS ABUSE 12/18/2008  . BLINDNESS, LEFT EYE 12/18/2008  . PTERYGIUM 12/18/2008  .  MICROALBUMINURIA 12/18/2008    HARRIS,KAREN 12/04/2014, 4:45 PM  [facility] [address] [place], [place], [address] Phone: [phone]   Fax:  [phone]     Karen Harris, PTA 12/04/2014 4:45 PM Phone: [phone] Fax: [phone]             PHYSICAL THERAPY DISCHARGE SUMMARY  Visits from Start of Care: 2  Current functional level related to goals / functional outcomes:  FOTO 42% limited   Remaining deficits: See goals   Education / Equipment: HEP Plan:                                                    Patient goals were not met. Patient is being discharged due to not returning since the last visit.  ?????        Kristoffer Leamon PT, DPT, LAT, ATC  12/22/2014  8:47 AM      

## 2014-12-05 NOTE — Telephone Encounter (Signed)
Will inform patient of normal results on Monday when in clinic with pcp. Jazmin Hartsell,CMA

## 2014-12-08 ENCOUNTER — Ambulatory Visit: Payer: 59

## 2014-12-08 ENCOUNTER — Ambulatory Visit: Payer: 59 | Admitting: Family Medicine

## 2014-12-08 ENCOUNTER — Telehealth: Payer: Self-pay | Admitting: Family Medicine

## 2014-12-08 NOTE — Telephone Encounter (Signed)
Informed of x-ray results. Getting MRI.   CGM MD

## 2014-12-15 ENCOUNTER — Ambulatory Visit: Payer: 59 | Attending: Family Medicine | Admitting: Physical Therapy

## 2014-12-16 ENCOUNTER — Telehealth: Payer: Self-pay | Admitting: Physical Therapy

## 2014-12-16 NOTE — Telephone Encounter (Signed)
Attempted to call patient regarding missed appointment yesterday at 3:45 as well as to confirm his next appointment at 7:30 am tomorrow. No answer and no voicemail. Will cancel future appointments after second no show.

## 2014-12-17 ENCOUNTER — Ambulatory Visit: Payer: 59 | Admitting: Physical Therapy

## 2014-12-22 ENCOUNTER — Ambulatory Visit: Payer: 59 | Admitting: Physical Therapy

## 2014-12-25 ENCOUNTER — Encounter: Payer: 59 | Admitting: Physical Therapy

## 2015-04-02 ENCOUNTER — Ambulatory Visit (INDEPENDENT_AMBULATORY_CARE_PROVIDER_SITE_OTHER): Payer: 59 | Admitting: *Deleted

## 2015-04-02 DIAGNOSIS — Z23 Encounter for immunization: Secondary | ICD-10-CM

## 2015-04-20 ENCOUNTER — Other Ambulatory Visit: Payer: Self-pay | Admitting: Family Medicine

## 2015-04-20 DIAGNOSIS — E785 Hyperlipidemia, unspecified: Secondary | ICD-10-CM

## 2015-07-27 ENCOUNTER — Encounter: Payer: Self-pay | Admitting: Family Medicine

## 2015-07-27 ENCOUNTER — Ambulatory Visit (INDEPENDENT_AMBULATORY_CARE_PROVIDER_SITE_OTHER): Payer: 59 | Admitting: Family Medicine

## 2015-07-27 VITALS — BP 121/75 | HR 67 | Temp 98.0°F | Ht 68.0 in | Wt 164.8 lb

## 2015-07-27 DIAGNOSIS — E785 Hyperlipidemia, unspecified: Secondary | ICD-10-CM

## 2015-07-27 DIAGNOSIS — E1165 Type 2 diabetes mellitus with hyperglycemia: Secondary | ICD-10-CM | POA: Diagnosis not present

## 2015-07-27 DIAGNOSIS — IMO0001 Reserved for inherently not codable concepts without codable children: Secondary | ICD-10-CM

## 2015-07-27 LAB — POCT GLYCOSYLATED HEMOGLOBIN (HGB A1C): Hemoglobin A1C: 11.5

## 2015-07-27 MED ORDER — METFORMIN HCL 500 MG PO TABS: 1000.0000 mg | ORAL_TABLET | Freq: Two times a day (BID) | ORAL | Status: DC

## 2015-07-27 MED ORDER — ATORVASTATIN CALCIUM 10 MG PO TABS
ORAL_TABLET | ORAL | Status: DC
Start: 2015-07-27 — End: 2016-06-15

## 2015-07-27 NOTE — Patient Instructions (Signed)
Please increase your metformin to 1000mg  (2 pills) twice a day for a total of 2000mg  per day.

## 2015-07-29 NOTE — Assessment & Plan Note (Signed)
Uncontrolled, A1c 11.5, taking only 500 metformin bid because of "hypoglycemia" but not actually checking sugars. Refuses any injectable. True lows of 40-60 on glipizide - increase metformin to 1000mg  bid, f/u in 1 month to discuss other options and assess progress - will start checking glucose at least once a day and bring log to next visit - pt says he will not take any injectable no matter what due to severe needle phobia

## 2015-07-29 NOTE — Progress Notes (Signed)
   Subjective:    Patient ID: James Pratt, male    DOB: 1970/12/20, 45 y.o.   MRN: [id number]  HPI CHRONIC DIABETES  Disease Monitoring  Blood Sugar Ranges: does not check  Polyuria: no   Visual problems: yes, is blind in L eye   Medication Compliance: no, only taking 500 bid metformin Medication Side Effects  Hypoglycemia: 40-60 when taking glipizide, now he thinks he's low if he takes 1000mg  metformin but he doesn't actually check  Preventitive Health Care  Eye Exam: Jan 2016  Foot Exam: Dec 2015  Diet pattern: high fat and sugar but trying to do better  Exercise: minimal, works in [employer] representative[employer]     Review of Systems See HPI    Objective:   Physical Exam  Constitutional: He is oriented to person, place, and time. He appears well-developed and well-nourished. No distress.  HENT:  Head: Normocephalic and atraumatic.  Cardiovascular: Normal rate.   Pulmonary/Chest: Effort normal.  Abdominal: He exhibits no distension.  Musculoskeletal: Normal range of motion. He exhibits no edema or tenderness.       Feet:  Neurological: He is alert and oriented to person, place, and time.  Skin: Skin is warm and dry. He is not diaphoretic.  Psychiatric: He has a normal mood and affect. His behavior is normal.  Nursing note and vitals reviewed.         Assessment & Plan:  Diabetes mellitus type II, uncontrolled Uncontrolled, A1c 11.5, taking only 500 metformin bid because of "hypoglycemia" but not actually checking sugars. Refuses any injectable. True lows of 40-60 on glipizide - increase metformin to 1000mg  bid, f/u in 1 month to discuss other options and assess progress - will start checking glucose at least once a day and bring log to next visit - pt says he will not take any injectable no matter what due to severe needle phobia

## 2016-03-08 DIAGNOSIS — Z23 Encounter for immunization: Secondary | ICD-10-CM | POA: Diagnosis not present

## 2016-06-15 ENCOUNTER — Encounter: Payer: Self-pay | Admitting: Family Medicine

## 2016-06-15 ENCOUNTER — Ambulatory Visit (INDEPENDENT_AMBULATORY_CARE_PROVIDER_SITE_OTHER): Payer: 59 | Admitting: Family Medicine

## 2016-06-15 VITALS — BP 118/76 | HR 74 | Temp 98.3°F | Ht 68.0 in | Wt 177.0 lb

## 2016-06-15 DIAGNOSIS — E784 Other hyperlipidemia: Secondary | ICD-10-CM | POA: Diagnosis not present

## 2016-06-15 DIAGNOSIS — IMO0001 Reserved for inherently not codable concepts without codable children: Secondary | ICD-10-CM

## 2016-06-15 DIAGNOSIS — E1165 Type 2 diabetes mellitus with hyperglycemia: Secondary | ICD-10-CM

## 2016-06-15 DIAGNOSIS — E7849 Other hyperlipidemia: Secondary | ICD-10-CM

## 2016-06-15 LAB — POCT GLYCOSYLATED HEMOGLOBIN (HGB A1C): HEMOGLOBIN A1C: 10.6

## 2016-06-15 MED ORDER — ATORVASTATIN CALCIUM 10 MG PO TABS
ORAL_TABLET | ORAL | 11 refills | Status: DC
Start: 2016-06-15 — End: 2019-03-22

## 2016-06-15 MED ORDER — METFORMIN HCL 500 MG PO TABS: 1000.0000 mg | ORAL_TABLET | Freq: Two times a day (BID) | ORAL | 11 refills | Status: DC

## 2016-06-15 NOTE — Progress Notes (Signed)
1521

## 2016-06-15 NOTE — Patient Instructions (Addendum)
It was nice meeting you in clinic today! You were seen for diabetes follow up and for a physical.  Please make sure to take your Metformin everyday as prescribed.  I have refilled this as well as your Lipitor and they are available for pickup at your pharmacy.  As we discussed, you should try to moderate the amount of carbohydrates you eat and opt for other healthier food choices such as lean meats and vegetables.  You can follow up in 3 months for recheck of your A1c.    Please call clinic with questions.   Be well,  Freddrick March, MD

## 2016-06-15 NOTE — Progress Notes (Signed)
   Subjective:   Patient ID: James Pratt    DOB: 03/19/1970, 46 y.o. male   MRN: [id number]  CC: medication refill, DM f/u  HPI: James Pratt is a 46 y.o. male who presents to clinic today for DM follow up and medication refills.    T2DM Feels he is doing well.  Checks sugars at home every now and then. Does not keep a record of home CBGs.  Usually ~200s.  Reports good compliance with medications.  Is on Metformin 1000 mg BID, Lipitor 10 mg daily.  Both refilled today.  Diet- eats a lot of fruits.  Diet consists mainly of [place] food, salsa, refried beans, tortillas, cream of potatos, mac and cheese, pork chops.  Drinks plenty of water.  Exercise- working outside, lot of labor work.    ROS: Denies fevers, chills, nausea, vomiting, diarrhea.  Denies shortness of breath, abdominal pain, dizziness.   Patient is a current smoker.  Smokes 1 cigar every other day.  Medications reviewed. Objective:   BP 118/76   Pulse 74   Temp 98.3 F (36.8 C) (Oral)   Ht _0  (1.727 m)   Wt 177 lb (80.3 kg)   SpO2 98%   BMI 26.91 kg/m  Vitals and nursing note reviewed.  General: 46 yo M in NAD HEENT:NCAT, MMM, o/p clear Neck: supple CV: RRR no MRG  Lungs: CTA B/L with comfortable work of breathing  Abdomen: soft, NTND, +bs Skin: warm, dry Extremities: no edema or tenderness  Assessment & Plan:   Diabetes mellitus type II, uncontrolled Uncontrolled, A1c 10.6 today.  Reports good compliance on Metformin 1000 mg BID,  refilled today.   -Discussed healthy eating and eating carbs in moderation.   -Advised him to check sugars at least once a day at home and keep a record -Will monitor in 3 months.  Patient does not want any injectable insulin at all.   Orders Placed This Encounter  Procedures  . POCT glycosylated hemoglobin (Hb A1C)   Meds ordered this encounter  Medications  . atorvastatin (LIPITOR) 10 MG tablet    Sig: TAKE ONE TABLET EACH DAY    Dispense:  30 tablet   Refill:  11  . metFORMIN (GLUCOPHAGE) 500 MG tablet    Sig: Take 2 tablets (1,000 mg total) by mouth 2 (two) times daily with a meal.    Dispense:  120 tablet    Refill:  11    Lovenia Kim, MD [place], PGY-1 06/19/2016 7:15 PM

## 2016-06-19 NOTE — Assessment & Plan Note (Signed)
Uncontrolled, A1c 10.6 today.  Reports good compliance on Metformin 1000 mg BID,  refilled today.   -Discussed healthy eating and eating carbs in moderation.   -Advised him to check sugars at least once a day at home and keep a record -Will monitor in 3 months.  Patient does not want any injectable insulin at all.

## 2016-09-30 ENCOUNTER — Ambulatory Visit (INDEPENDENT_AMBULATORY_CARE_PROVIDER_SITE_OTHER): Payer: 59 | Admitting: Internal Medicine

## 2016-09-30 VITALS — Temp 98.1°F | Wt 177.0 lb

## 2016-09-30 DIAGNOSIS — I951 Orthostatic hypotension: Secondary | ICD-10-CM | POA: Diagnosis not present

## 2016-09-30 DIAGNOSIS — G629 Polyneuropathy, unspecified: Secondary | ICD-10-CM | POA: Insufficient documentation

## 2016-09-30 DIAGNOSIS — IMO0001 Reserved for inherently not codable concepts without codable children: Secondary | ICD-10-CM

## 2016-09-30 DIAGNOSIS — E1165 Type 2 diabetes mellitus with hyperglycemia: Secondary | ICD-10-CM | POA: Diagnosis not present

## 2016-09-30 LAB — POCT GLYCOSYLATED HEMOGLOBIN (HGB A1C): HEMOGLOBIN A1C: 9.6

## 2016-09-30 LAB — GLUCOSE, POCT (MANUAL RESULT ENTRY): POC Glucose: 200 mg/dl — AB (ref 70–99)

## 2016-09-30 MED ORDER — SITAGLIPTIN PHOSPHATE 50 MG PO TABS: 50.0000 mg | ORAL_TABLET | Freq: Every day | ORAL | 0 refills | Status: DC

## 2016-09-30 MED ORDER — PREGABALIN 75 MG PO CAPS: 75.0000 mg | ORAL_CAPSULE | Freq: Two times a day (BID) | ORAL | 0 refills | Status: DC

## 2016-09-30 MED ORDER — GLUCOSE BLOOD VI STRP: ORAL_STRIP | 12 refills | Status: DC

## 2016-09-30 NOTE — Patient Instructions (Addendum)
Make an appointment to see Dr. Raymondo BandKoval diabetes. I want to start on Januvia. I will prescribe you  pregabalin for your diabetic neuropathy. Please make sure to drink enough fluids.

## 2016-09-30 NOTE — Progress Notes (Signed)
   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 and then he gets up, he feels really dizzy  - Has been occurring for about a week and half - Has not had an recent changes in medication  - Patient blood sugars have been elevated  - More tired lately, having significant headaches   - Patient has been peeing a lot, more than regular - Patient has been drinking plenty of fluid  - Denies palpations, chest pain, SOB, no lose of conicous, no focal weakness or focal sensation changes - No vomiting up blood, no blood in stools  - No nausea or vomitting   Diabetes - Last A1c 11.5 - Only on metformin 1000 mg BID  - Glipizide in the past with significant hypoglycemia in the 40s in the past. Not willing to check CBGs therefore glipizide was stopped - Has not tried any other oral diabetic medications  - Endorse polyuria - Has not seen opthalmology due to expense associated with it  - Indicates burning sensation in feet and lower legs bilaterally - not improved by gabapentin   Past Medical History Reviewed problem list.  Medications- reviewed and updated No additions to family history Social history- patient is a non- smoker  Objective: Temp 98.1 F (36.7 C) (Oral)   Wt 177 lb (80.3 kg)   BMI 26.91 kg/m  Gen: NAD, alert, cooperative with exam Cardio: regular rate and rhythm, S1S2 heard, no murmurs appreciated Pulm: clear to auscultation bilaterally, no wheezes, rhonchi or rales Neurologic exam : Cn 2-7 intact Strength equal & normal in upper & lower extremities Able to walk on heels and toes.   Balance normal  Ffinger to nose wnl  Assessment/Plan: See problem based a/p  Orthostatic hypotension Patient's current episodes of positional dizziness most consistent with orthostatic hypotension. Blood pressures on evaluation positive  for orthostatics with a 20 point decrease in blood pressure from laying to standing. Patient with several years of uncontrolled diabetes mellitus, patient notes polyneuropathy- therefore orthostatic hypotension likely due to autonomic neuropathy.  Patient also has a history of severe dehydration and polyuria associated with elevated blood sugars. -Discussed that patient needs to better control his blood sugars in order to help with his orthostatic hypotension -Maintain hydration with both Gatorade and water - CBC with Differential/Platelet - Basic metabolic panel - POCT HgB A1C (CPT 83036) - Glucose (CBG)   Diabetes mellitus type II, uncontrolled A1C 9.6  Continue metformin Will add Januvia 50 mg daily  Would like to see Dr. Raymondo BandKoval, has a lot of respect for him given he is helped with his wife's diabetes. Will refer patient to pharmacy  Follow up with PCP   Polyneuropathy No improvement with gabapentin  Would like to try Lyrica

## 2016-10-01 LAB — CBC WITH DIFFERENTIAL/PLATELET
Basophils Absolute: 0 10*3/uL (ref 0.0–0.2)
Basos: 0 %
EOS (ABSOLUTE): 0.1 10*3/uL (ref 0.0–0.4)
Eos: 2 %
HEMOGLOBIN: 13.5 g/dL (ref 13.0–17.7)
Hematocrit: 40.7 % (ref 37.5–51.0)
IMMATURE GRANS (ABS): 0 10*3/uL (ref 0.0–0.1)
IMMATURE GRANULOCYTES: 0 %
LYMPHS ABS: 2.5 10*3/uL (ref 0.7–3.1)
LYMPHS: 37 %
MCH: 29.2 pg (ref 26.6–33.0)
MCHC: 33.2 g/dL (ref 31.5–35.7)
MCV: 88 fL (ref 79–97)
Monocytes Absolute: 0.6 10*3/uL (ref 0.1–0.9)
Monocytes: 9 %
NEUTROS ABS: 3.4 10*3/uL (ref 1.4–7.0)
Neutrophils: 52 %
PLATELETS: 157 10*3/uL (ref 150–379)
RBC: 4.63 x10E6/uL (ref 4.14–5.80)
RDW: 13.3 % (ref 12.3–15.4)
WBC: 6.6 10*3/uL (ref 3.4–10.8)

## 2016-10-01 LAB — BASIC METABOLIC PANEL
BUN/Creatinine Ratio: 14 (ref 9–20)
BUN: 16 mg/dL (ref 6–24)
CALCIUM: 10.1 mg/dL (ref 8.7–10.2)
CHLORIDE: 99 mmol/L (ref 96–106)
CO2: 25 mmol/L (ref 20–29)
Creatinine, Ser: 1.13 mg/dL (ref 0.76–1.27)
GFR calc non Af Amer: 77 mL/min/{1.73_m2} (ref >59)
GFR, EST AFRICAN AMERICAN: 89 mL/min/{1.73_m2} (ref >59)
GLUCOSE: 180 mg/dL — AB (ref 65–99)
Potassium: 4.9 mmol/L (ref 3.5–5.2)
Sodium: 139 mmol/L (ref 134–144)

## 2016-10-03 DIAGNOSIS — I951 Orthostatic hypotension: Secondary | ICD-10-CM

## 2016-10-03 HISTORY — DX: Orthostatic hypotension: I95.1

## 2016-10-03 NOTE — Assessment & Plan Note (Signed)
A1C 9.6  Continue metformin Will add Januvia 50 mg daily  Would like to see Dr. Raymondo BandKoval, has a lot of respect for him given he is helped with his wife's diabetes. Will refer patient to pharmacy  Follow up with PCP

## 2016-10-03 NOTE — Assessment & Plan Note (Signed)
No improvement with gabapentin  Would like to try Lyrica

## 2016-10-03 NOTE — Assessment & Plan Note (Addendum)
Patient's current episodes of positional dizziness most consistent with orthostatic hypotension. Blood pressures on evaluation positive for orthostatics with a 20 point decrease in blood pressure from laying to standing. Patient with several years of uncontrolled diabetes mellitus, patient notes polyneuropathy- therefore orthostatic hypotension likely due to autonomic neuropathy.  Patient also has a history of severe dehydration and polyuria associated with elevated blood sugars. -Discussed that patient needs to better control his blood sugars in order to help with his orthostatic hypotension -Maintain hydration with both Gatorade and water - CBC with Differential/Platelet - Basic metabolic panel - POCT HgB A1C (CPT 83036) - Glucose (CBG)

## 2016-10-06 ENCOUNTER — Ambulatory Visit (INDEPENDENT_AMBULATORY_CARE_PROVIDER_SITE_OTHER): Payer: 59 | Admitting: Pharmacist

## 2016-10-06 ENCOUNTER — Encounter: Payer: Self-pay | Admitting: Internal Medicine

## 2016-10-06 ENCOUNTER — Other Ambulatory Visit: Payer: Self-pay

## 2016-10-06 ENCOUNTER — Encounter: Payer: Self-pay | Admitting: Pharmacist

## 2016-10-06 DIAGNOSIS — E1165 Type 2 diabetes mellitus with hyperglycemia: Secondary | ICD-10-CM

## 2016-10-06 DIAGNOSIS — F17200 Nicotine dependence, unspecified, uncomplicated: Secondary | ICD-10-CM

## 2016-10-06 DIAGNOSIS — IMO0001 Reserved for inherently not codable concepts without codable children: Secondary | ICD-10-CM

## 2016-10-06 DIAGNOSIS — E7849 Other hyperlipidemia: Secondary | ICD-10-CM

## 2016-10-06 DIAGNOSIS — E784 Other hyperlipidemia: Secondary | ICD-10-CM | POA: Diagnosis not present

## 2016-10-06 DIAGNOSIS — E7800 Pure hypercholesterolemia, unspecified: Secondary | ICD-10-CM

## 2016-10-06 MED ORDER — DULAGLUTIDE 0.75 MG/0.5ML ~~LOC~~ SOAJ: 0.7500 mg | SUBCUTANEOUS | 1 refills | Status: DC

## 2016-10-06 MED ORDER — ATORVASTATIN CALCIUM 40 MG PO TABS: 40.0000 mg | ORAL_TABLET | Freq: Every day | ORAL | 3 refills | Status: DC

## 2016-10-06 NOTE — Patient Instructions (Addendum)
Thanks for coming to see us today! Here are the medication changes we talked about today:  1. Start Trulicity 0.75 mg injection once daily. Pick a day of the week, and be sure to take this medication on the same day every week.  2. Increase your atorvastatin from 10 mg to 40 mg once daily. You can pick up your atorvastatin 40mg  prescription at the pharmacy.  2. Do not pick up your Januvia.   3. You can pick up Lyrica and your OneTouch test strips at the pharmacy, and continue checking your blood sugar at least twice daily. Please bring your blood sugar log to next visit.  Please come back and see us in 1 month.

## 2016-10-06 NOTE — Assessment & Plan Note (Signed)
Diabetes longstanding currently uncontrolled with last A1C 9.6% (7/27). Patient reports hypoglycemic events and is able to verbalize appropriate hypoglycemia management plan. Reports adherence with medication, though he was unable to pick up his Januvia from pharmacy on 09/30/16 due to delay in prior authorization. Has been checking his blood sugars on his wife's glucometer because he does not have test strips for his own OneTouch glucometer. Has a fear of needles, and is hesitant to start insulin injections.  -Start dulaglutide (Trulicity) 0.75 mg injection once weekly. Consider increasing to 1.5 mg injection once weekly at next visit. -Do not pick up Januvia from the pharmacy -OneTouch test strips sent to pharmacy -Next A1C anticipated in 2-3 months.

## 2016-10-06 NOTE — Progress Notes (Signed)
    S:     Chief Complaint  Patient presents with  . Medication Management    diabetes    Patient arrives ambulating without assistance and with wife (in wheelchair) and daughter. Patient's wife is well known to pharmacy clinic.   Presents for diabetes evaluation, education, and management at the request of Dr. Cathlean CowerMikell. Patient was referred on 09/30/16.  Patient was last seen by Primary Care Provider on 09/30/16.  Patient reports Diabetes was diagnosed in 2005-2006.  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 Prescribed sitagliptin (Januvia) 50 mg once daily and pregabalin (Lyrica) 75mg  BID by Dr. Cathlean CowerMikell on 09/30/16, but has not picked up from pharmacy due to delay in prior authorization. Current diabetes medications include: metformin 1000 mg BID.  Patient reports some hypoglycemic events, in which he eats crackers and candies.  Patient reported dietary habits: Eats 2-3 meals/day. Patient has been cutting down on sugary drinks, and have started drinking diet soda and unsweetened tea.   O:  Physical Exam  Constitutional: He appears well-developed and well-nourished.   Review of Systems  All other systems reviewed and are negative.  Lab Results  Component Value Date   HGBA1C 9.6 09/30/2016   Vitals:   10/06/16 1556  BP: 117/72  Pulse: 72   Home fasting CBG: 187-237 per wife  10 year ASCVD risk: 11.7%  A/P: Diabetes longstanding currently uncontrolled with last A1C 9.6% (7/27). Patient reports hypoglycemic events and is able to verbalize appropriate hypoglycemia management plan. Reports adherence with medication, though he was unable to pick up his Januvia from pharmacy on 09/30/16 due to delay in prior authorization. Has been checking his blood sugars on his wife's glucometer because he does not have test strips for his own OneTouch glucometer. Has a fear of needles, and is hesitant to start insulin injections.    -Start dulaglutide (Trulicity) 0.75 mg injection once weekly. Consider increasing to 1.5 mg injection once weekly at next visit. -Do not pick up Januvia from the pharmacy -OneTouch test strips sent to pharmacy -Next A1C anticipated in 2-3 months.  ASCVD risk greater than 7.5%.Increased dose of atorvastatin from 10 mg once daily to 40 mg once daily.   Recently started smoking 1/3 PPD. Not interested in quitting smoking. -Reassess at next visit.  Written patient instructions provided.  Total time in face to face counseling 30 minutes.   Follow up in Pharmacist Clinic Visit in 1 month.   Patient seen with Katherine MantleHeysel Lam, PharmD Candidate, and Al CorpusLindsey Foltanski, PharmD PGY-1 Resident.

## 2016-10-06 NOTE — Assessment & Plan Note (Signed)
Not interested in quitting at this time

## 2016-10-07 ENCOUNTER — Telehealth: Payer: Self-pay | Admitting: *Deleted

## 2016-10-07 NOTE — Telephone Encounter (Signed)
[facility] for Lyrica 75 mg and Januvia 50 mg.  PA form placed in provider box for completion. Clovis PuMartin, Tamika L, RN

## 2016-10-10 MED ORDER — GLUCOSE BLOOD VI STRP: ORAL_STRIP | 12 refills | Status: DC

## 2016-10-10 NOTE — Addendum Note (Signed)
Addended by: Kathrin RuddyKOVAL, PETER G on: 10/10/2016 09:48 AM   Modules accepted: Orders

## 2016-10-10 NOTE — Progress Notes (Signed)
Patient ID: James Pratt, male   DOB: 11/11/1970, 45 y.o.   MRN: [id number] Reviewed: Agree with Dr. Koval's documentation and management. 

## 2016-10-13 NOTE — Telephone Encounter (Signed)
Wife is calling to check on the status of the PA for his medications. Please give her an update. jw

## 2016-10-14 NOTE — Telephone Encounter (Signed)
Called [facility] to check status of Lyrica PA.  PA is still in the review process.  Clovis PuMartin, Camari Wisham L, RN

## 2016-10-18 NOTE — Telephone Encounter (Signed)
PA was denied for Lyrica via [facility]. Denial information placed in provider box for review.  Clovis PuMartin, Tamika L, RN

## 2016-11-10 ENCOUNTER — Ambulatory Visit (INDEPENDENT_AMBULATORY_CARE_PROVIDER_SITE_OTHER): Payer: 59 | Admitting: Pharmacist

## 2016-11-10 ENCOUNTER — Encounter: Payer: Self-pay | Admitting: Pharmacist

## 2016-11-10 DIAGNOSIS — E1165 Type 2 diabetes mellitus with hyperglycemia: Secondary | ICD-10-CM

## 2016-11-10 DIAGNOSIS — IMO0001 Reserved for inherently not codable concepts without codable children: Secondary | ICD-10-CM

## 2016-11-10 DIAGNOSIS — F17200 Nicotine dependence, unspecified, uncomplicated: Secondary | ICD-10-CM | POA: Diagnosis not present

## 2016-11-10 MED ORDER — DULAGLUTIDE 0.75 MG/0.5ML ~~LOC~~ SOAJ: 0.7500 mg | SUBCUTANEOUS | 3 refills | Status: DC

## 2016-11-10 MED ORDER — METFORMIN HCL 500 MG PO TABS: 1000.0000 mg | ORAL_TABLET | Freq: Two times a day (BID) | ORAL | 11 refills | Status: DC

## 2016-11-10 NOTE — Patient Instructions (Signed)
Great to see you today!     Great news on the improved blood sugar control with Trulicity AND Metformin.   Please continue to take your metformin twice daily.   Follow up with Dr. Nelson ChimesAmin in 2 months.

## 2016-11-10 NOTE — Assessment & Plan Note (Signed)
Diabetes longstanding currently uncontrolled as evidenced by A1C, however improved given CBG log. Patient denies hypoglycemic events and is able to verbalize appropriate hypoglycemia management plan. Patient denies complete adherence to medications (metformin). Control is suboptimal due to medication noncompliance, dietary indiscretion, insulin resistance. -Continue Trulicity at lower dose -Resume metformin 1000 mg BID

## 2016-11-10 NOTE — Assessment & Plan Note (Signed)
Chronic tobacco abuse- currently smoking ~ 7 cigs per day and willing to consider cutting down at this time.  Agreed on goal of 5 cigs per day in the next month.  Consider setting quit date in the near future.

## 2016-11-10 NOTE — Progress Notes (Signed)
    S:     Chief Complaint  Patient presents with  . Medication Management    Diabetes    Patient arrives in good spirits and stating his blood sugars are much improved. Presents for diabetes evaluation, education, and management at the request of Dr. Cathlean CowerMikell. Patient was referred on 09/30/16.  Patient was last seen by Primary Care Provider on 09/30/16. Last seen in pharmacy clinic 10/06/2016. Sitagliptin d/c'd and Trulicity started at that time.  Patient reports adherence with medications however he did admit to taking less metformin than prescribed recently. Tolerating Trulicity well at this time. States that he did not tolerate increase to atorvastatin 40 mg daily as he experienced auditory changes. Has been cutting atorvastatin tabs in 1/2 and tolerating the 20 mg dose well.   Current diabetes medications include: Trulicity 0.75mg  once weekly and Metformin 1000mg  twice daily  Patient denies hypoglycemic events.  Patient endorses improvements in diet and has noticed that his CBGs are higher after eating out at [employer].   Patient reported exercise habits: strenuous job, no other exercise   Patient denies nocturia.  Patient reports neuropathy when sitting still, improves with exercise.  Patient denies visual changes. Patient reports self foot exams.   O:  Physical Exam  Constitutional: He appears well-developed and well-nourished.  Vitals reviewed.   Review of Systems  All other systems reviewed and are negative.  Lab Results  Component Value Date   HGBA1C 9.6 09/30/2016   There were no vitals filed for this visit.  Home fasting CBG: 130s-180s  A/P: Diabetes longstanding currently uncontrolled as evidenced by A1C, however improved given CBG log. Patient denies hypoglycemic events and is able to verbalize appropriate hypoglycemia management plan. Patient denies complete adherence to medications (metformin). Control is suboptimal due to medication noncompliance, dietary  indiscretion, insulin resistance. -Continue Trulicity at lower dose -Resume metformin 1000 mg BID  Chronic tobacco abuse- currently smoking ~ 7 cigs per day and willing to consider cutting down at this time.  Agreed on goal of 5 cigs per day in the next month.  Consider setting quit date in the near future.   Next A1C anticipated October 2018.    ASCVD risk greater than 7.5%. Continued  atorvastatin 20 mg as currently tolerating this dose.   Written patient instructions provided.  Total time in face to face counseling 25 minutes.   Follow up with PCP in 2 months.   Patient seen with Devota Pacearoline Welles, PharmD PGY-2 Resident.

## 2016-11-11 NOTE — Progress Notes (Signed)
Patient ID: James Pratt, male   DOB: 1971-01-22, 46 y.o.   MRN: [id number] Reviewed: Agree with Dr. Macky LowerKoval's documentation and management.

## 2017-03-22 DIAGNOSIS — H3582 Retinal ischemia: Secondary | ICD-10-CM | POA: Diagnosis not present

## 2017-03-22 DIAGNOSIS — T85398D Other mechanical complication of other ocular prosthetic devices, implants and grafts, subsequent encounter: Secondary | ICD-10-CM | POA: Diagnosis not present

## 2017-03-22 DIAGNOSIS — H35031 Hypertensive retinopathy, right eye: Secondary | ICD-10-CM | POA: Diagnosis not present

## 2017-03-22 DIAGNOSIS — E113291 Type 2 diabetes mellitus with mild nonproliferative diabetic retinopathy without macular edema, right eye: Secondary | ICD-10-CM | POA: Diagnosis not present

## 2017-04-10 ENCOUNTER — Telehealth: Payer: Self-pay | Admitting: Family Medicine

## 2017-04-10 DIAGNOSIS — Z23 Encounter for immunization: Secondary | ICD-10-CM | POA: Diagnosis not present

## 2017-04-10 NOTE — Telephone Encounter (Signed)
Daughter was diagnosed with flu type a.  He hasnt had the flu shot. He wouol like to get some medicine to help prevent the flu. Please advise

## 2017-04-13 ENCOUNTER — Other Ambulatory Visit: Payer: Self-pay | Admitting: Family Medicine

## 2017-04-13 MED ORDER — OSELTAMIVIR PHOSPHATE 75 MG PO CAPS: 75.0000 mg | ORAL_CAPSULE | Freq: Every day | ORAL | 0 refills | Status: AC

## 2017-04-13 NOTE — Telephone Encounter (Signed)
[facility].  Would also recommend he gets a flu shot.  If patient agreeable, we can schedule nurse visit for this.  Please call patient to inform.

## 2017-04-13 NOTE — Progress Notes (Signed)
Opened in error

## 2017-04-13 NOTE — Telephone Encounter (Signed)
Pt informed but declined to come in to get a flu shot. Gray Doering Bruna PotterBlount, CMA

## 2017-04-14 ENCOUNTER — Ambulatory Visit (HOSPITAL_COMMUNITY)
Admission: EM | Admit: 2017-04-14 | Discharge: 2017-04-14 | Disposition: A | Discharge status: Home / Self Care | Payer: 59 | Attending: Internal Medicine | Admitting: Internal Medicine

## 2017-04-14 ENCOUNTER — Encounter (HOSPITAL_COMMUNITY): Payer: Self-pay | Admitting: Emergency Medicine

## 2017-04-14 DIAGNOSIS — J40 Bronchitis, not specified as acute or chronic: Secondary | ICD-10-CM | POA: Diagnosis not present

## 2017-04-14 MED ORDER — BENZONATATE 100 MG PO CAPS: 100.0000 mg | ORAL_CAPSULE | Freq: Three times a day (TID) | ORAL | 0 refills | Status: DC

## 2017-04-14 MED ORDER — FLUTICASONE PROPIONATE 50 MCG/ACT NA SUSP: 2.0000 | Freq: Every day | NASAL | 0 refills | Status: DC

## 2017-04-14 MED ORDER — AZITHROMYCIN 250 MG PO TABS: 250.0000 mg | ORAL_TABLET | Freq: Every day | ORAL | 0 refills | Status: DC

## 2017-04-14 MED ORDER — IPRATROPIUM BROMIDE 0.06 % NA SOLN: 2.0000 | Freq: Four times a day (QID) | NASAL | 0 refills | Status: DC

## 2017-04-14 MED ORDER — NAPROXEN 500 MG PO TABS: 500.0000 mg | ORAL_TABLET | Freq: Two times a day (BID) | ORAL | 0 refills | Status: AC

## 2017-04-14 NOTE — ED Provider Notes (Signed)
[facility]    CSN: [id number] Arrival date & time: 04/14/17  1550     History   Chief Complaint Chief Complaint  Patient presents with  . URI    HPI James Pratt is a 47 y.o. male.   47 year old male with history of DM, alcohol abuse, cannabis abuse, HLD comes in for 3-day history of worsening URI symptoms.  States he has had productive cough for the past 4 weeks, 3 days ago started having worsening cough with mild rhinorrhea.  Denies nasal congestion.  Has had body aches and sneezing.  Denies fever, chills, night sweats. Denies shortness of breath, wheezing. Patient was recently put on Tamiflu for prophylaxis treatment due to positive sick contact with the flu. otc tylenol without relief.  He is a current every day smoker.      Past Medical History:  Diagnosis Date  . Diabetes mellitus without complication [facility])     Patient Active Problem List   Diagnosis Date Noted  . Orthostatic hypotension 10/03/2016  . Polyneuropathy 09/30/2016  . Hypercholesteremia 06/25/2014  . Arthritis 06/25/2014  . Onychomycosis 06/25/2014  . TOBACCO USER 01/17/2009  . Diabetes mellitus type II, uncontrolled (HCC) 12/18/2008  . ALCOHOL ABUSE 12/18/2008  . CANNABIS ABUSE 12/18/2008  . BLINDNESS, LEFT EYE 12/18/2008  . PTERYGIUM 12/18/2008  . 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 No pertinent surgical history.     Home Medications    Prior to Admission medications   Medication Sig Start Date End Date Taking? Authorizing Provider  atorvastatin (LIPITOR) 10 MG tablet TAKE ONE TABLET EACH DAY 06/15/16  Yes Freddrick March, MD  atorvastatin (LIPITOR) 40 MG tablet Take 1 tablet (40 mg total) by mouth daily. Patient taking differently: Take 20 mg by mouth daily.  10/06/16  Yes Hensel, Santiago Bumpers, MD  dorzolamide-timolol (COSOPT) 22.3-6.8 MG/ML ophthalmic solution Place 1 drop into the left eye daily. 10/13/11  Yes [provider]  Dulaglutide (TRULICITY) 0.75  MG/0.5ML SOPN Inject 0.75 mg into the skin once a week. 11/10/16  Yes Hensel, Santiago Bumpers, MD  metFORMIN (GLUCOPHAGE) 500 MG tablet Take 2 tablets (1,000 mg total) by mouth 2 (two) times daily with a meal. 11/10/16  Yes Hensel, Santiago Bumpers, MD  acetaminophen (TYLENOL) 500 MG tablet Take 1,000 mg by mouth every 6 (six) hours as needed for moderate pain.    [provider]  azithromycin (ZITHROMAX) 250 MG tablet Take 1 tablet (250 mg total) by mouth daily. Take first 2 tablets together, then 1 every day until finished. 04/14/17   Cathie Hoops, Amy V, PA-C  benzonatate (TESSALON) 100 MG capsule Take 1 capsule (100 mg total) by mouth every 8 (eight) hours. 04/14/17   Cathie Hoops, Amy V, PA-C  fluticasone (FLONASE) 50 MCG/ACT nasal spray Place 2 sprays into both nostrils daily. 04/14/17   Cathie Hoops, Amy V, PA-C  glucose blood (ONE TOUCH ULTRA TEST) test strip Dispense QS for twice daily testing 10/10/16   Moses Manners, MD  glucose blood test strip Use as instructed 09/30/16   Mikell, Antionette Poles, MD  ibuprofen (ADVIL,MOTRIN) 200 MG tablet Take 800 mg by mouth every 6 (six) hours as needed for moderate pain.    [provider]  ipratropium (ATROVENT) 0.06 % nasal spray Place 2 sprays into both nostrils 4 (four) times daily. 04/14/17   Cathie Hoops, Amy V, PA-C  naproxen (NAPROSYN) 500 MG tablet Take 1 tablet (500 mg total) by mouth 2 (two) times daily for 10 days. 04/14/17 04/24/17  Cathie HoopsYu, Amy V, PA-C  oseltamivir (TAMIFLU) 75 MG capsule Take 1 capsule (75 mg total) by mouth daily for 10 days. 04/13/17 04/23/17  Freddrick MarchAmin, Yashika, MD  pregabalin (LYRICA) 75 MG capsule Take 1 capsule (75 mg total) by mouth 2 (two) times daily. Patient not taking: Reported on 10/06/2016 09/30/16   Berton BonMikell, Asiyah Zahra, MD    Family History History reviewed. No pertinent family history.  Social History Social History   Tobacco Use  . Smoking status: Current Every Day Smoker    Packs/day: 0.25    Types: Cigars  . Smokeless tobacco: Never Used  Substance Use  Topics  . Alcohol use: No    Alcohol/week: 0.0 oz  . Drug use: No     Allergies   Meloxicam   Review of Systems Review of Systems  Reason unable to perform ROS: See HPI as above.     Physical Exam Triage Vital Signs ED Triage Vitals  Enc Vitals Group     BP 04/14/17 1713 106/80     Pulse Rate 04/14/17 1713 77     Resp 04/14/17 1713 16     Temp 04/14/17 1713 99.1 F (37.3 C)     Temp Source 04/14/17 1713 Oral     SpO2 04/14/17 1713 100 %     Weight --      Height --      Head Circumference --      Peak Flow --      Pain Score 04/14/17 1711 8     Pain Loc --      Pain Edu? --      Excl. in GC? --    No data found.  Updated Vital Signs BP 106/80 (BP Location: Left Arm)   Pulse 77   Temp 99.1 F (37.3 C) (Oral)   Resp 16   SpO2 100%   Physical Exam  Constitutional: He is oriented to person, place, and time. He appears well-developed and well-nourished. No distress.  HENT:  Head: Normocephalic and atraumatic.  Right Ear: Tympanic membrane, external ear and ear canal normal. Tympanic membrane is not erythematous and not bulging.  Left Ear: Tympanic membrane, external ear and ear canal normal. Tympanic membrane is not erythematous and not bulging.  Nose: Nose normal. Right sinus exhibits no maxillary sinus tenderness and no frontal sinus tenderness. Left sinus exhibits no maxillary sinus tenderness and no frontal sinus tenderness.  Mouth/Throat: Uvula is midline, oropharynx is clear and moist and mucous membranes are normal.  Eyes: Conjunctivae and lids are normal.  Neck: Normal range of motion. Neck supple.  Cardiovascular: Normal rate, regular rhythm and normal heart sounds. Exam reveals no gallop and no friction rub.  No murmur heard. Pulmonary/Chest: Effort normal and breath sounds normal. He has no decreased breath sounds. He has no wheezes. He has no rhonchi. He has no rales.  Lymphadenopathy:    He has no cervical adenopathy.  Neurological: He is alert  and oriented to person, place, and time.  Skin: Skin is warm and dry.  Psychiatric: He has a normal mood and affect. His behavior is normal. Judgment normal.    UC Treatments / Results  Labs (all labs ordered are listed, but only abnormal results are displayed) Labs Reviewed - No data to display  Lab Results  Component Value Date   HGBA1C 9.6 09/30/2016     EKG  EKG Interpretation None       Radiology No results found.  Procedures Procedures (including critical care time)  Medications Ordered in UC Medications - No data to display   Initial Impression / Assessment and Plan / UC Course  I have reviewed the triage vital signs and the nursing notes.  Pertinent labs & imaging results that were available during my care of the patient were reviewed by me and considered in my medical decision making (see chart for details).    Given 4-week history of productive cough, will treat for bronchitis with azithromycin.  Patient with smoking history, without history of COPD.  Given uncontrolled diabetes, will defer prednisone use for now.  Other symptomatic treatment discussed.  Push fluids.  Follow-up with PCP for reevaluation needed.  Return precautions given.  Patient expresses understanding and agrees to plan.  Final Clinical Impressions(s) / UC Diagnoses   Final diagnoses:  Bronchitis    ED Discharge Orders        Ordered    benzonatate (TESSALON) 100 MG capsule  Every 8 hours     04/14/17 1746    azithromycin (ZITHROMAX) 250 MG tablet  Daily     04/14/17 1746    fluticasone (FLONASE) 50 MCG/ACT nasal spray  Daily     04/14/17 1746    ipratropium (ATROVENT) 0.06 % nasal spray  4 times daily     04/14/17 1746    naproxen (NAPROSYN) 500 MG tablet  2 times daily     04/14/17 1746        Belinda Fisher, PA-C 04/14/17 1752

## 2017-04-14 NOTE — Discharge Instructions (Signed)
Start azithromycin as directed. Tessalon for cough. Start flonase, atrovent nasal spray, zyrtec-D for nasal congestion/drainage. You can use over the counter nasal saline rinse such as neti pot for nasal congestion. Keep hydrated, your urine should be clear to pale yellow in color. Tylenol/motrin for fever and pain. Monitor for any worsening of symptoms, chest pain, shortness of breath, wheezing, swelling of the throat, follow up for reevaluation.   You can take naproxen as directed for pain. Follow up with PCP for reevaluation needed.

## 2017-04-14 NOTE — ED Triage Notes (Signed)
PT C/O: cold sx onset 3 days associated w/cough, trembles, back pain, sneezing  DENIES: fevers  TAKING MEDS: Theraflu, acetaminophen, Nyquil.   A&O x4... NAD... Ambulatory

## 2017-05-08 ENCOUNTER — Other Ambulatory Visit: Payer: Self-pay

## 2017-05-08 ENCOUNTER — Ambulatory Visit: Payer: 59 | Admitting: Student

## 2017-05-08 ENCOUNTER — Encounter: Payer: Self-pay | Admitting: Student

## 2017-05-08 VITALS — BP 114/70 | HR 73 | Temp 98.1°F | Ht 68.0 in | Wt 167.8 lb

## 2017-05-08 DIAGNOSIS — E119 Type 2 diabetes mellitus without complications: Secondary | ICD-10-CM | POA: Diagnosis not present

## 2017-05-08 DIAGNOSIS — E1165 Type 2 diabetes mellitus with hyperglycemia: Secondary | ICD-10-CM

## 2017-05-08 DIAGNOSIS — S90229A Contusion of unspecified lesser toe(s) with damage to nail, initial encounter: Secondary | ICD-10-CM | POA: Diagnosis not present

## 2017-05-08 HISTORY — DX: Contusion of unspecified lesser toe(s) with damage to nail, initial encounter: S90.229A

## 2017-05-08 LAB — POCT GLYCOSYLATED HEMOGLOBIN (HGB A1C): HEMOGLOBIN A1C: 7.4

## 2017-05-08 NOTE — Progress Notes (Signed)
  Subjective:    James Pratt is a 47 y.o. old male here for toenail discoloration  HPI Toenail discoloration: bilateral. For one week. He noticed this after he wore a small, high heel shoe one week ago. There was also some bruising over his toenail folds and clear looking discharge. He reports a throbbing pain. Pain is about 5/10.  Denies fever or chills. Denies pus looking discharge.  He has history of diabetes.  A1c 7.4% today.  PMH/Problem List: has Diabetes mellitus type II, uncontrolled (HCC); ALCOHOL ABUSE; TOBACCO USER; CANNABIS ABUSE; BLINDNESS, LEFT EYE; PTERYGIUM; MICROALBUMINURIA; Hypercholesteremia; Arthritis; Onychomycosis; Polyneuropathy; Orthostatic hypotension; and Contusion of toenail on their problem list.   has a past medical history of Diabetes mellitus without complication (HCC).  FH:  History reviewed. No pertinent family history.  SH Social History   Tobacco Use  . Smoking status: Current Every Day Smoker    Packs/day: 0.25    Types: Cigars  . Smokeless tobacco: Never Used  Substance Use Topics  . Alcohol use: No    Alcohol/week: 0.0 oz  . Drug use: No    Review of Systems Review of systems negative except for pertinent positives and negatives in history of present illness above.     Objective:     Vitals:   05/08/17 1431  BP: 114/70  Pulse: 73  Temp: 98.1 F (36.7 C)  TempSrc: Oral  SpO2: 99%  Weight: 167 lb 12.8 oz (76.1 kg)  Height: 5\' 8"  (1.727 m)   Body mass index is 25.51 kg/m.  Physical Exam  GEN: appears well, no apparent distress. CVS: RRR, nl s1 & s2, no murmurs, no edema,  2+ DP pulses bilaterally, cap refills < 2 secs RESP: no IWOB MSK: See below SKIN: See below NEURO: alert and oiented appropriately, no gross deficits  PSYCH: euthymic mood with congruent affect  Diabetic Foot Exam: Inspection: Small bruises over toenail folds bilaterally, ulcer or callus.  Nail discoloration of the big toes bilaterally.  Toenails trimmed.    Vascular: DP & PT pulses 2+ bilaterally Neuro: Intact 9-point monofilament exam bilaterally     Assessment and Plan:  1. Contusion of toenail: does not appear to have drainable hematoma on exam.  Mild bruising of the surrounding skin.  Neurovascularly  intact.  Recommend wearing loosely-fitted shoes.  Discussed return precautions including Recommended keeping it dry and clean.  Discussed return precautions including swelling, purulent discharge, worsening pain, or other symptoms concerning to him.  2. Well controlled diabetes mellitus (HCC): A1c 7.4% today.  Congratulated him on this.  Recommended follow-up with PCP to discuss more about his diabetes.  Exam within normal limits except for big toenail pruise bilaterally  Return if symptoms worsen or fail to improve.  Almon Herculesaye T Lakeyia Surber, MD 05/08/17 Pager: [phone]

## 2017-05-08 NOTE — Patient Instructions (Signed)
It was great seeing you today! We have addressed the following issues today  To nail discoloration: There is no signs of infection.  Please wear a loosely fitting shoe.  Keep your feet clean and dry.  Please watch for swelling, skin discoloration, worsening pain, pus-looking discharge or other symptoms concerning to you.  Follow-up with your primary care doctor as needed.  Diabetes: Congratulations!  Your A1c is down from 9.6% to 7.4% today.  Please follow-up with your primary care doctor to discuss about this further.  If we did any lab work today, and the results require attention, either me or my nurse will get in touch with you. If everything is normal, you will get a letter in mail and a message via . If you don't hear from us in two weeks, please give [facility] a call. Otherwise, we look forward to seeing you again at your next visit. If you have any questions or concerns before then, please call the clinic at [phone].  Please bring all your medications to every doctors visit  Sign up for My Chart to have easy access to your labs results, and communication with your Primary care physician.    Please check-out at the front desk before leaving the clinic.    Take Care,   Dr. Alanda SlimGonfa

## 2017-06-19 ENCOUNTER — Emergency Department (HOSPITAL_COMMUNITY): Payer: 59

## 2017-06-19 ENCOUNTER — Other Ambulatory Visit: Payer: Self-pay

## 2017-06-19 ENCOUNTER — Encounter (HOSPITAL_COMMUNITY): Payer: Self-pay | Admitting: Radiology

## 2017-06-19 ENCOUNTER — Emergency Department (HOSPITAL_COMMUNITY)
Admission: EM | Admit: 2017-06-19 | Discharge: 2017-06-19 | Disposition: A | Discharge status: Home / Self Care | Payer: 59 | Attending: Emergency Medicine | Admitting: Emergency Medicine

## 2017-06-19 DIAGNOSIS — F17210 Nicotine dependence, cigarettes, uncomplicated: Secondary | ICD-10-CM | POA: Diagnosis not present

## 2017-06-19 DIAGNOSIS — R1011 Right upper quadrant pain: Secondary | ICD-10-CM

## 2017-06-19 DIAGNOSIS — Z7984 Long term (current) use of oral hypoglycemic drugs: Secondary | ICD-10-CM | POA: Insufficient documentation

## 2017-06-19 DIAGNOSIS — Z79899 Other long term (current) drug therapy: Secondary | ICD-10-CM | POA: Insufficient documentation

## 2017-06-19 DIAGNOSIS — E119 Type 2 diabetes mellitus without complications: Secondary | ICD-10-CM | POA: Insufficient documentation

## 2017-06-19 DIAGNOSIS — R110 Nausea: Secondary | ICD-10-CM | POA: Diagnosis not present

## 2017-06-19 DIAGNOSIS — R109 Unspecified abdominal pain: Secondary | ICD-10-CM | POA: Diagnosis not present

## 2017-06-19 LAB — COMPREHENSIVE METABOLIC PANEL
ALT: 20 U/L (ref 17–63)
AST: 21 U/L (ref 15–41)
Albumin: 3.8 g/dL (ref 3.5–5.0)
Alkaline Phosphatase: 84 U/L (ref 38–126)
Anion gap: 6 (ref 5–15)
BILIRUBIN TOTAL: 0.5 mg/dL (ref 0.3–1.2)
BUN: 7 mg/dL (ref 6–20)
CO2: 27 mmol/L (ref 22–32)
CREATININE: 0.84 mg/dL (ref 0.61–1.24)
Calcium: 8.7 mg/dL — ABNORMAL LOW (ref 8.9–10.3)
Chloride: 103 mmol/L (ref 101–111)
Glucose, Bld: 277 mg/dL — ABNORMAL HIGH (ref 65–99)
Potassium: 3.8 mmol/L (ref 3.5–5.1)
Sodium: 136 mmol/L (ref 135–145)
TOTAL PROTEIN: 6.3 g/dL — AB (ref 6.5–8.1)

## 2017-06-19 LAB — CBC
HEMATOCRIT: 37.4 % — AB (ref 39.0–52.0)
Hemoglobin: 12.8 g/dL — ABNORMAL LOW (ref 13.0–17.0)
MCH: 30.3 pg (ref 26.0–34.0)
MCHC: 34.2 g/dL (ref 30.0–36.0)
MCV: 88.4 fL (ref 78.0–100.0)
PLATELETS: 127 10*3/uL — AB (ref 150–400)
RBC: 4.23 MIL/uL (ref 4.22–5.81)
RDW: 13 % (ref 11.5–15.5)
WBC: 4.7 10*3/uL (ref 4.0–10.5)

## 2017-06-19 LAB — URINALYSIS, ROUTINE W REFLEX MICROSCOPIC
BILIRUBIN URINE: NEGATIVE
GLUCOSE, UA: 50 mg/dL — AB
Hgb urine dipstick: NEGATIVE
KETONES UR: NEGATIVE mg/dL
Leukocytes, UA: NEGATIVE
Nitrite: NEGATIVE
PROTEIN: NEGATIVE mg/dL
Specific Gravity, Urine: 1.008 (ref 1.005–1.030)
pH: 6 (ref 5.0–8.0)

## 2017-06-19 LAB — LIPASE, BLOOD: LIPASE: 28 U/L (ref 11–51)

## 2017-06-19 MED ORDER — IOPAMIDOL (ISOVUE-300) INJECTION 61%
100.0000 mL | Freq: Once | INTRAVENOUS | Status: AC | PRN
  Administered 2017-06-19: 100 mL via INTRAVENOUS

## 2017-06-19 MED ORDER — PANTOPRAZOLE SODIUM 40 MG PO TBEC: 40.0000 mg | DELAYED_RELEASE_TABLET | Freq: Every day | ORAL | 0 refills | Status: DC

## 2017-06-19 MED ORDER — SODIUM CHLORIDE 0.9 % IV BOLUS
1000.0000 mL | Freq: Once | INTRAVENOUS | Status: AC
  Administered 2017-06-19: 1000 mL via INTRAVENOUS

## 2017-06-19 MED ORDER — HYDROMORPHONE HCL 2 MG/ML IJ SOLN
0.5000 mg | Freq: Once | INTRAMUSCULAR | Status: AC
  Administered 2017-06-19: 0.5 mg via INTRAVENOUS
  Filled 2017-06-19: qty 1

## 2017-06-19 MED ORDER — IOPAMIDOL (ISOVUE-300) INJECTION 61%
INTRAVENOUS | Status: DC
Start: 2017-06-19 — End: 2017-06-19
  Filled 2017-06-19: qty 100

## 2017-06-19 MED ORDER — TRAMADOL HCL 50 MG PO TABS: 50.0000 mg | ORAL_TABLET | Freq: Four times a day (QID) | ORAL | 0 refills | Status: DC | PRN

## 2017-06-19 NOTE — Discharge Instructions (Addendum)
It was our pleasure to provide your ER care today - we hope that you feel better.  Take protonix (acid blocker medication). You may also try pepcid or maalox as need for symptom relief.   Take motrin or aleve as need for pain.   You may take ultram as need for pain - no driving for the next 4 hours or when taking ultram.  Follow up with primary care doctor in the coming week.  Return to ER if worse, new symptoms, fevers, severe pain, persistent  vomiting, chest pain, trouble breathing, other concern.

## 2017-06-19 NOTE — ED Triage Notes (Signed)
Pt states for the last 2 days he has been having RUQ pain. Pt seen by MD Denton LankSteinl in triage.

## 2017-06-19 NOTE — ED Provider Notes (Signed)
[facility] EMERGENCY DEPARTMENT Provider Note   CSN: [id number] Arrival date & time: 06/19/17  4098     History   Chief Complaint Chief Complaint  Patient presents with  . Abdominal Pain    HPI James Pratt is a 47 y.o. male.  Patient c/o right abd pain for the past 1-2 days. Pain constant, dull, moderate, non radiating. Nausea w eating. No vomiting. Is having normal bms. No hx same pain. No hx prior abd surgery. No hx gallstones. No dysuria or gu c/o. No fever or chills.   The history is provided by the patient.    Past Medical History:  Diagnosis Date  . Diabetes mellitus without complication [facility])     Patient Active Problem List   Diagnosis Date Noted  . Contusion of toenail 05/08/2017  . Orthostatic hypotension 10/03/2016  . Polyneuropathy 09/30/2016  . Hypercholesteremia 06/25/2014  . Arthritis 06/25/2014  . Onychomycosis 06/25/2014  . TOBACCO USER 01/17/2009  . Diabetes mellitus type II, uncontrolled (HCC) 12/18/2008  . ALCOHOL ABUSE 12/18/2008  . CANNABIS ABUSE 12/18/2008  . BLINDNESS, LEFT EYE 12/18/2008  . PTERYGIUM 12/18/2008  . MICROALBUMINURIA 12/18/2008    No past surgical history on file.      Home Medications    Prior to Admission medications   Medication Sig Start Date End Date Taking? Authorizing Provider  acetaminophen (TYLENOL) 500 MG tablet Take 1,000 mg by mouth every 6 (six) hours as needed for moderate pain.    [provider]  atorvastatin (LIPITOR) 10 MG tablet TAKE ONE TABLET EACH DAY 06/15/16   Freddrick March, MD  atorvastatin (LIPITOR) 40 MG tablet Take 1 tablet (40 mg total) by mouth daily. Patient taking differently: Take 20 mg by mouth daily.  10/06/16   [place] Manners, MD  azithromycin (ZITHROMAX) 250 MG tablet Take 1 tablet (250 mg total) by mouth daily. Take first 2 tablets together, then 1 every day until finished. 04/14/17   Cathie Hoops, Amy V, PA-C  benzonatate (TESSALON) 100 MG capsule Take 1  capsule (100 mg total) by mouth every 8 (eight) hours. 04/14/17   Cathie Hoops, Amy V, PA-C  dorzolamide-timolol (COSOPT) 22.3-6.8 MG/ML ophthalmic solution Place 1 drop into the left eye daily. 10/13/11   [provider]  Dulaglutide (TRULICITY) 0.75 MG/0.5ML SOPN Inject 0.75 mg into the skin once a week. 11/10/16   [place] Manners, MD  fluticasone (FLONASE) 50 MCG/ACT nasal spray Place 2 sprays into both nostrils daily. 04/14/17   Cathie Hoops, Amy V, PA-C  glucose blood (ONE TOUCH ULTRA TEST) test strip Dispense QS for twice daily testing 10/10/16   [place] Manners, MD  glucose blood test strip Use as instructed 09/30/16   Mikell, Antionette Poles, MD  ibuprofen (ADVIL,MOTRIN) 200 MG tablet Take 800 mg by mouth every 6 (six) hours as needed for moderate pain.    [provider]  ipratropium (ATROVENT) 0.06 % nasal spray Place 2 sprays into both nostrils 4 (four) times daily. 04/14/17   Cathie Hoops, Amy V, PA-C  metFORMIN (GLUCOPHAGE) 500 MG tablet Take 2 tablets (1,000 mg total) by mouth 2 (two) times daily with a meal. 11/10/16   Hensel, Santiago Bumpers, MD  pregabalin (LYRICA) 75 MG capsule Take 1 capsule (75 mg total) by mouth 2 (two) times daily. Patient not taking: Reported on 10/06/2016 09/30/16   Berton Bon, MD    Family History No family history on file.  Social History Social History   Tobacco Use  . Smoking  status: Current Every Day Smoker    Packs/day: 0.25    Types: Cigars  . Smokeless tobacco: Never Used  Substance Use Topics  . Alcohol use: No    Alcohol/week: 0.0 oz  . Drug use: No     Allergies   Meloxicam   Review of Systems Review of Systems  Constitutional: Negative for fever.  HENT: Negative for sore throat.   Eyes: Negative for redness.  Respiratory: Negative for shortness of breath.   Cardiovascular: Negative for chest pain.  Gastrointestinal: Positive for abdominal pain. Negative for diarrhea and vomiting.  Genitourinary: Negative for dysuria, flank pain and hematuria.    Musculoskeletal: Negative for back pain.  Skin: Negative for rash.  Neurological: Negative for headaches.  Hematological: Does not bruise/bleed easily.  Psychiatric/Behavioral: Negative for confusion.     Physical Exam Updated Vital Signs BP 114/74 (BP Location: Right Arm)   Pulse 60   Temp 98.1 F (36.7 C) (Oral)   Resp 16   Ht 1.727 m (5\' 8" )   Wt 74.4 kg (164 lb)   SpO2 100%   BMI 24.94 kg/m   Physical Exam  Constitutional: He appears well-developed and well-nourished. No distress.  HENT:  Mouth/Throat: Oropharynx is clear and moist.  Eyes: Conjunctivae are normal.  Neck: Neck supple. No tracheal deviation present.  Cardiovascular: Normal rate, regular rhythm, normal heart sounds and intact distal pulses.  Pulmonary/Chest: Effort normal and breath sounds normal. No accessory muscle usage. No respiratory distress.  Abdominal: Soft. Bowel sounds are normal. He exhibits no distension and no mass. There is tenderness. There is no rebound and no guarding. No hernia.  Right lower and upper abd tenderness.   Genitourinary:  Genitourinary Comments: No cva tenderness  Musculoskeletal: He exhibits no edema.  Neurological: He is alert.  Skin: Skin is warm and dry. No rash noted. He is not diaphoretic.  Psychiatric: He has a normal mood and affect.  Nursing note and vitals reviewed.    ED Treatments / Results  Labs (all labs ordered are listed, but only abnormal results are displayed) Labs Reviewed  CBC  COMPREHENSIVE METABOLIC PANEL  LIPASE, BLOOD  URINALYSIS, ROUTINE W REFLEX MICROSCOPIC    EKG None  Radiology No results found.  Procedures Procedures (including critical care time)  Medications Ordered in ED Medications  sodium chloride 0.9 % bolus 1,000 mL (has no administration in time range)     Initial Impression / Assessment and Plan / ED Course  I have reviewed the triage vital signs and the nursing notes.  Pertinent labs & imaging results that  were available during my care of the patient were reviewed by me and considered in my medical decision making (see chart for details).  Labs and imaging ordered.  Iv ns.   Reviewed nursing notes and prior charts for additional history.   Ct reviewed - normal appendix, no hydro.   Persistent ruq pain on recheck. Mild tenderness to area.   U/s reviewed - no gallstones.   Patient was given dilaudid .5 mg iv for pain.  Recheck pain improved. Afebrile. No nv.   Patient currently appears stable for d/c.      Final Clinical Impressions(s) / ED Diagnoses   Final diagnoses:  None    ED Discharge Orders    None       Cathren LaineSteinl, Mariah Gerstenberger, MD 06/19/17 1444

## 2017-11-13 ENCOUNTER — Emergency Department (HOSPITAL_COMMUNITY)
Admission: EM | Admit: 2017-11-13 | Discharge: 2017-11-14 | Disposition: A | Discharge status: Home / Self Care | Payer: 59 | Attending: Emergency Medicine | Admitting: Emergency Medicine

## 2017-11-13 ENCOUNTER — Encounter (HOSPITAL_COMMUNITY): Payer: Self-pay | Admitting: *Deleted

## 2017-11-13 ENCOUNTER — Other Ambulatory Visit: Payer: Self-pay

## 2017-11-13 DIAGNOSIS — Z7984 Long term (current) use of oral hypoglycemic drugs: Secondary | ICD-10-CM | POA: Insufficient documentation

## 2017-11-13 DIAGNOSIS — F17210 Nicotine dependence, cigarettes, uncomplicated: Secondary | ICD-10-CM | POA: Diagnosis not present

## 2017-11-13 DIAGNOSIS — R1011 Right upper quadrant pain: Secondary | ICD-10-CM | POA: Diagnosis not present

## 2017-11-13 DIAGNOSIS — R110 Nausea: Secondary | ICD-10-CM | POA: Diagnosis not present

## 2017-11-13 DIAGNOSIS — E119 Type 2 diabetes mellitus without complications: Secondary | ICD-10-CM | POA: Insufficient documentation

## 2017-11-13 DIAGNOSIS — Z79899 Other long term (current) drug therapy: Secondary | ICD-10-CM | POA: Diagnosis not present

## 2017-11-13 LAB — CBC
HCT: 39 % (ref 39.0–52.0)
Hemoglobin: 13 g/dL (ref 13.0–17.0)
MCH: 29.9 pg (ref 26.0–34.0)
MCHC: 33.3 g/dL (ref 30.0–36.0)
MCV: 89.7 fL (ref 78.0–100.0)
PLATELETS: 147 10*3/uL — AB (ref 150–400)
RBC: 4.35 MIL/uL (ref 4.22–5.81)
RDW: 12.5 % (ref 11.5–15.5)
WBC: 5.4 10*3/uL (ref 4.0–10.5)

## 2017-11-13 LAB — COMPREHENSIVE METABOLIC PANEL
ALK PHOS: 72 U/L (ref 38–126)
ALT: 16 U/L (ref 0–44)
AST: 21 U/L (ref 15–41)
Albumin: 3.7 g/dL (ref 3.5–5.0)
Anion gap: 7 (ref 5–15)
BILIRUBIN TOTAL: 0.6 mg/dL (ref 0.3–1.2)
BUN: 10 mg/dL (ref 6–20)
CO2: 27 mmol/L (ref 22–32)
CREATININE: 0.94 mg/dL (ref 0.61–1.24)
Calcium: 9.2 mg/dL (ref 8.9–10.3)
Chloride: 105 mmol/L (ref 98–111)
Glucose, Bld: 193 mg/dL — ABNORMAL HIGH (ref 70–99)
Potassium: 4.2 mmol/L (ref 3.5–5.1)
Sodium: 139 mmol/L (ref 135–145)
TOTAL PROTEIN: 6.4 g/dL — AB (ref 6.5–8.1)

## 2017-11-13 LAB — URINALYSIS, ROUTINE W REFLEX MICROSCOPIC
Bilirubin Urine: NEGATIVE
Glucose, UA: NEGATIVE mg/dL
HGB URINE DIPSTICK: NEGATIVE
Ketones, ur: NEGATIVE mg/dL
Leukocytes, UA: NEGATIVE
Nitrite: NEGATIVE
PH: 6 (ref 5.0–8.0)
PROTEIN: NEGATIVE mg/dL
Specific Gravity, Urine: 1.009 (ref 1.005–1.030)

## 2017-11-13 LAB — LIPASE, BLOOD: LIPASE: 64 U/L — AB (ref 11–51)

## 2017-11-13 MED ORDER — ACETAMINOPHEN 325 MG PO TABS
650.0000 mg | ORAL_TABLET | Freq: Once | ORAL | Status: AC
  Administered 2017-11-13: 650 mg via ORAL
  Filled 2017-11-13: qty 2

## 2017-11-13 MED ORDER — MORPHINE SULFATE (PF) 4 MG/ML IV SOLN
4.0000 mg | Freq: Once | INTRAVENOUS | Status: AC
  Administered 2017-11-13: 4 mg via INTRAVENOUS
  Filled 2017-11-13: qty 1

## 2017-11-13 NOTE — ED Triage Notes (Signed)
Pt reports right side abd pain since Saturday. Denies n/v/d. Denies any difficulty urinating but reports recent constipation.

## 2017-11-13 NOTE — ED Provider Notes (Signed)
[facility] EMERGENCY DEPARTMENT Provider Note   CSN: [id number] Arrival date & time: 11/13/17  1352     History   Chief Complaint Chief Complaint  Patient presents with  . Abdominal Pain    HPI James Pratt is a 47 y.o. male with past medical history of diabetes, alcohol abuse, presenting to the ED with complaint of right upper quadrant abdominal pain that began on Saturday.  Patient states pain feels like gas pain that waxes and wanes and radiates to his right upper back.  Reports he took Gas-X without relief of symptoms.  States Tylenol given in triage did provide some relief.  Does endorse some straining with stools.  Denies associated nausea, vomiting, diarrhea or urinary symptoms.  He was evaluated in the ED for the exact same symptoms in April of this year.  CT abdomen pelvis as well as right upper quadrant ultrasound were done and negative.  Denies alcohol use, though endorses marijuana use.  The history is provided by the patient and the spouse.    Past Medical History:  Diagnosis Date  . Diabetes mellitus without complication [facility])     Patient Active Problem List   Diagnosis Date Noted  . Contusion of toenail 05/08/2017  . Orthostatic hypotension 10/03/2016  . Polyneuropathy 09/30/2016  . Hypercholesteremia 06/25/2014  . Arthritis 06/25/2014  . Onychomycosis 06/25/2014  . TOBACCO USER 01/17/2009  . Diabetes mellitus type II, uncontrolled (HCC) 12/18/2008  . ALCOHOL ABUSE 12/18/2008  . CANNABIS ABUSE 12/18/2008  . BLINDNESS, LEFT EYE 12/18/2008  . PTERYGIUM 12/18/2008  . 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 No pertinent surgical history.      Home Medications    Prior to Admission medications   Medication Sig Start Date End Date Taking? Authorizing Provider  acetaminophen (TYLENOL) 500 MG tablet Take 1,000 mg by mouth every 6 (six) hours as needed for moderate pain.    [provider]  atorvastatin (LIPITOR)  10 MG tablet TAKE ONE TABLET EACH DAY 06/15/16   Freddrick March, MD  atorvastatin (LIPITOR) 40 MG tablet Take 1 tablet (40 mg total) by mouth daily. Patient taking differently: Take 20 mg by mouth daily.  10/06/16   Moses Manners, MD  azithromycin (ZITHROMAX) 250 MG tablet Take 1 tablet (250 mg total) by mouth daily. Take first 2 tablets together, then 1 every day until finished. 04/14/17   Cathie Hoops, Amy V, PA-C  benzonatate (TESSALON) 100 MG capsule Take 1 capsule (100 mg total) by mouth every 8 (eight) hours. 04/14/17   Cathie Hoops, Amy V, PA-C  dorzolamide-timolol (COSOPT) 22.3-6.8 MG/ML ophthalmic solution Place 1 drop into the left eye daily. 10/13/11   [provider]  Dulaglutide (TRULICITY) 0.75 MG/0.5ML SOPN Inject 0.75 mg into the skin once a week. 11/10/16   Moses Manners, MD  fluticasone (FLONASE) 50 MCG/ACT nasal spray Place 2 sprays into both nostrils daily. 04/14/17   Cathie Hoops, Amy V, PA-C  glucose blood (ONE TOUCH ULTRA TEST) test strip Dispense QS for twice daily testing 10/10/16   Moses Manners, MD  glucose blood test strip Use as instructed 09/30/16   Mikell, Antionette Poles, MD  ibuprofen (ADVIL,MOTRIN) 200 MG tablet Take 800 mg by mouth every 6 (six) hours as needed for moderate pain.    [provider]  ipratropium (ATROVENT) 0.06 % nasal spray Place 2 sprays into both nostrils 4 (four) times daily. 04/14/17   Cathie Hoops, Amy V, PA-C  metFORMIN (GLUCOPHAGE) 500 MG tablet Take 2  tablets (1,000 mg total) by mouth 2 (two) times daily with a meal. 11/10/16   Hensel, Santiago Bumpers, MD  pantoprazole (PROTONIX) 40 MG tablet Take 1 tablet (40 mg total) by mouth daily. 06/19/17   Cathren Laine, MD  pregabalin (LYRICA) 75 MG capsule Take 1 capsule (75 mg total) by mouth 2 (two) times daily. Patient not taking: Reported on 10/06/2016 09/30/16   Berton Bon, MD  traMADol (ULTRAM) 50 MG tablet Take 1 tablet (50 mg total) by mouth every 6 (six) hours as needed. 06/19/17   Cathren Laine, MD    Family  History History reviewed. No pertinent family history.  Social History Social History   Tobacco Use  . Smoking status: Current Every Day Smoker    Packs/day: 0.25    Types: Cigars  . Smokeless tobacco: Never Used  Substance Use Topics  . Alcohol use: No    Alcohol/week: 0.0 standard drinks  . Drug use: No     Allergies   Meloxicam   Review of Systems Review of Systems  Constitutional: Negative for appetite change and fever.  Gastrointestinal: Positive for abdominal pain and constipation. Negative for blood in stool, diarrhea, nausea and vomiting.  Genitourinary: Negative for dysuria and frequency.  All other systems reviewed and are negative.    Physical Exam Updated Vital Signs BP (!) 141/82 (BP Location: Right Arm)   Pulse (!) 50   Temp 98.7 F (37.1 C) (Oral)   Resp 16   SpO2 98%   Physical Exam  Constitutional: He appears well-developed and well-nourished.  HENT:  Head: Normocephalic and atraumatic.  Eyes: Conjunctivae are normal.  Cardiovascular: Normal rate and regular rhythm.  Pulmonary/Chest: Effort normal and breath sounds normal. No respiratory distress.  Abdominal: Soft. Bowel sounds are normal. He exhibits no distension and no mass. There is no tenderness. There is no rebound and no guarding.  Right CVA tenderness  Neurological: He is alert.  Skin: Skin is warm.  Psychiatric: He has a normal mood and affect. His behavior is normal.  Nursing note and vitals reviewed.    ED Treatments / Results  Labs (all labs ordered are listed, but only abnormal results are displayed) Labs Reviewed  LIPASE, BLOOD - Abnormal; Notable for the following components:      Result Value   Lipase 64 (*)    All other components within normal limits  COMPREHENSIVE METABOLIC PANEL - Abnormal; Notable for the following components:   Glucose, Bld 193 (*)    Total Protein 6.4 (*)    All other components within normal limits  CBC - Abnormal; Notable for the following  components:   Platelets 147 (*)    All other components within normal limits  URINALYSIS, ROUTINE W REFLEX MICROSCOPIC - Abnormal; Notable for the following components:   Color, Urine STRAW (*)    All other components within normal limits    EKG None  Radiology No results found.  Procedures Procedures (including critical care time)  Medications Ordered in ED Medications  acetaminophen (TYLENOL) tablet 650 mg (650 mg Oral Given 11/13/17 1803)  morphine 4 MG/ML injection 4 mg (4 mg Intravenous Given 11/13/17 2228)  iopamidol (ISOVUE-300) 61 % injection 100 mL (100 mLs Intravenous Contrast Given 11/14/17 0047)     Initial Impression / Assessment and Plan / ED Course  I have reviewed the triage vital signs and the nursing notes.  Pertinent labs & imaging results that were available during my care of the patient were reviewed by me  and considered in my medical decision making (see chart for details).  Clinical Course as of Nov 14 49  Mon Nov 13, 2017  2340 Patient reevaluated and reports improvement in abdominal pain with pain medication.  UA negative.   [JR]    Clinical Course User Index [JR] Remon Quinto, [place] N, PA-C    Pt presenting to the ED with right upper quadrant abdominal pain with radiation to the back that began on Saturday.  Patient states he has had very similar symptoms in April of this year; chart review revealing negative work-up in the ED including negative right upper quadrant ultrasound and negative CT scan.  He states pain waxes and wanes and is not associated with nausea, vomiting, diarrhea, urinary symptoms or fever.  Tylenol given in triage provided some relief though pain returning upon evaluation.  Abdomen is benign and nontender though patient with some right sided CVA tenderness.  He is well-appearing and not in distress.  Labs revealing no leukocytosis, CMP with mild hyperglycemia otherwise unremarkable, UA negative.  Lipase is slightly elevated compared to  previous visit with similar symptoms. Pt discussed with Dr. Anitra Lauth; plan for CT to further evaluate. Pain managed in ED.   Care assumed by [place], PA, pending CT. If neg, anticipate discharge with symptomatic management and PCP follow up.  Final Clinical Impressions(s) / ED Diagnoses   Final diagnoses:  None    ED Discharge Orders    None       Zhanna Melin, [place] N, PA-C 11/14/17 1610    Gwyneth Sprout, MD 11/14/17 1408

## 2017-11-13 NOTE — ED Notes (Signed)
Pt. Is requesting an US of the stomach. Waiting for the Dr to order the [place]. Requesting some tylenol.

## 2017-11-13 NOTE — ED Notes (Signed)
Pt called for triage. No response.  

## 2017-11-14 ENCOUNTER — Emergency Department (HOSPITAL_COMMUNITY): Payer: 59

## 2017-11-14 ENCOUNTER — Encounter (HOSPITAL_COMMUNITY): Payer: Self-pay | Admitting: Radiology

## 2017-11-14 DIAGNOSIS — E119 Type 2 diabetes mellitus without complications: Secondary | ICD-10-CM | POA: Diagnosis not present

## 2017-11-14 DIAGNOSIS — R110 Nausea: Secondary | ICD-10-CM | POA: Diagnosis not present

## 2017-11-14 DIAGNOSIS — F17210 Nicotine dependence, cigarettes, uncomplicated: Secondary | ICD-10-CM | POA: Diagnosis not present

## 2017-11-14 DIAGNOSIS — R1011 Right upper quadrant pain: Secondary | ICD-10-CM | POA: Diagnosis not present

## 2017-11-14 MED ORDER — OXYCODONE-ACETAMINOPHEN 5-325 MG PO TABS: 1.0000 | ORAL_TABLET | ORAL | 0 refills | Status: DC | PRN

## 2017-11-14 MED ORDER — IOPAMIDOL (ISOVUE-300) INJECTION 61%
100.0000 mL | Freq: Once | INTRAVENOUS | Status: AC | PRN
  Administered 2017-11-14: 100 mL via INTRAVENOUS

## 2017-11-14 NOTE — ED Provider Notes (Signed)
Patient with history of RUQ abd pain, here for recurrent pain. No N, V, D. Exam benign April - neg RUQ [place] and CT Lipase today 64  Pending CT for eval of pancreas If neg - can go home with Tylenol as needed. F/U PCP. If positive, d/ch home with PCP follow up.   CT negative for any acute pathology. VSS. Pain continues to be managed. Discussed follow up with GI for recurrent RUQ abdominal pain. All questions answered.    Elpidio Anis, PA-C 11/14/17 0940    Gwyneth Sprout, MD 11/14/17 1407

## 2017-11-14 NOTE — Discharge Instructions (Addendum)
Follow up with gastroenterology for further evaluation of recurrent RUQ abdominal pain.

## 2017-11-15 ENCOUNTER — Other Ambulatory Visit (HOSPITAL_COMMUNITY): Payer: Self-pay | Admitting: Gastroenterology

## 2017-11-15 DIAGNOSIS — R748 Abnormal levels of other serum enzymes: Secondary | ICD-10-CM | POA: Diagnosis not present

## 2017-11-15 DIAGNOSIS — R1011 Right upper quadrant pain: Secondary | ICD-10-CM

## 2017-11-15 DIAGNOSIS — R1013 Epigastric pain: Secondary | ICD-10-CM | POA: Diagnosis not present

## 2017-11-15 DIAGNOSIS — R1310 Dysphagia, unspecified: Secondary | ICD-10-CM | POA: Diagnosis not present

## 2017-11-20 ENCOUNTER — Telehealth: Payer: Self-pay | Admitting: Family Medicine

## 2017-11-20 NOTE — Telephone Encounter (Signed)
Pt stated he did not want to make an appt at this time.

## 2017-11-24 ENCOUNTER — Encounter (HOSPITAL_COMMUNITY)
Admission: RE | Admit: 2017-11-24 | Discharge: 2017-11-24 | Disposition: A | Discharge status: Home / Self Care | Payer: 59 | Source: Ambulatory Visit | Attending: Gastroenterology | Admitting: Gastroenterology

## 2017-11-24 DIAGNOSIS — R1011 Right upper quadrant pain: Secondary | ICD-10-CM | POA: Insufficient documentation

## 2017-11-24 MED ORDER — TECHNETIUM TC 99M MEBROFENIN IV KIT
5.2000 | PACK | Freq: Once | INTRAVENOUS | Status: AC | PRN
  Administered 2017-11-24: 5.2 via INTRAVENOUS

## 2017-12-28 NOTE — Progress Notes (Signed)
Subjective:   Patient ID: James Pratt    DOB: [birth year], 47 y.o. male   MRN: [id number]  Dejon R Windsor is a 47 y.o. male with a history of DM2, arthritis, polysubstance use, L eye blindness, HLD here for   Neuropathy - Endorses years h/o tingling and pain that "feels like needles." Started in feet and hands and has slowly progressed to legs and arms. No numbness. Previously took gabapentin but it didn't help and it gave him a headache so he quit taking it. Couldn't afford Lyrica. Last A1c 7.4 05/2017. Quit taking trulicity months ago b/c he had decreased appetite and made him nauseous. Taking Metformin 1000mg  BID. No wounds or trauma to feet. - Diet: sometimes doesn't have great appetite. Decaf coffee with sweet and low and snack cake for breakfast. Bean and back soup with french fries for lunch today. For dinner last night, had pizza. Occasional donuts.  - felt bad all day yesterday. Went to sleep after watching tv. - CBGs at home 130-200s. 240s at the highest. 127 lowest.  Knee Pain - bilateral knees hurt for some time. 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 Will have pain when he gets up from working. Worse towards the end of the day. - took tramadol previously and it helps the pain but makes him dizzy and not feel right, sick. - hasn't tried ice, heating pad, pain creams - no deep pain, pain feels like it's on his skin. No rashes.  Review of Systems:  Per HPI.  PMFSH, medications and smoking status reviewed.  Objective:   BP 112/68   Pulse 84   Temp 98.7 F (37.1 C) (Oral)   Wt 172 lb (78 kg)   SpO2 98%   BMI 26.15 kg/m  Vitals and nursing note reviewed.  General: well nourished, well developed, in no acute distress with non-toxic appearance HEENT: normocephalic, atraumatic, moist mucous membranes CV: regular rate and rhythm without murmurs, rubs, or gallops, no lower extremity edema Lungs: clear to auscultation bilaterally  with normal work of breathing Skin: warm, dry, no rashes or lesions Extremities: warm and well perfused, normal tone. Bilateral knee exam without joint or ligament laxity. No swelling, erythema or warmth noted. MSK: ROM grossly intact, strength intact, gait normal Neuro: Alert and oriented, speech normal  Assessment & Plan:   Diabetes mellitus type II, uncontrolled Worsened, A1c 8.0 today. Extensive counseling provided regarding diabetic diet and decreasing carbs. Referral made to nutrition. Given intolerance to trulicity, will prescribe Jardiance. Foot exam completed today, normal. F/u with PCP in one month with CBGs.  Polyneuropathy Previously did not tolerate gabapentin, couldn't afford Lyrica. Advised to control sugars with lifestyle changes with diet and exercise. Denied tobacco use. Could consider TCA at next visit.  Bilateral knee pain States pain is on the surface, worse after working, denies h/o arthritis although noted in problem list. 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 No joint or ligament laxity on exam, no acute trauma. Will trial diclofenac gel and advised continued use of knee pads with added thickening.  Orders Placed This Encounter  Procedures  . Flu Vaccine QUAD 36+ mos IM  . Amb ref to Medical Nutrition Therapy-MNT    Referral Priority:   Routine    Referral Type:   Consultation    Referral Reason:   Specialty Services Required    Requested Specialty:   Nutrition    Number of Visits Requested:   1  .  HgB A1c   Meds ordered this encounter  Medications  . empagliflozin (JARDIANCE) 10 MG TABS tablet    Sig: Take 10 mg by mouth daily.    Dispense:  90 tablet    Refill:  0  . diclofenac sodium (VOLTAREN) 1 % GEL    Sig: Apply 2 g topically 4 (four) times daily.    Dispense:  1 Tube    Refill:  1    Ellwood Dense, DO PGY-2, [facility] 12/29/2017 5:13 PM

## 2017-12-29 ENCOUNTER — Other Ambulatory Visit: Payer: Self-pay

## 2017-12-29 ENCOUNTER — Ambulatory Visit: Payer: 59 | Admitting: Family Medicine

## 2017-12-29 VITALS — BP 112/68 | HR 84 | Temp 98.7°F | Wt 172.0 lb

## 2017-12-29 DIAGNOSIS — M25561 Pain in right knee: Secondary | ICD-10-CM | POA: Diagnosis not present

## 2017-12-29 DIAGNOSIS — M25562 Pain in left knee: Secondary | ICD-10-CM | POA: Insufficient documentation

## 2017-12-29 DIAGNOSIS — E1165 Type 2 diabetes mellitus with hyperglycemia: Secondary | ICD-10-CM | POA: Diagnosis not present

## 2017-12-29 DIAGNOSIS — G629 Polyneuropathy, unspecified: Secondary | ICD-10-CM

## 2017-12-29 DIAGNOSIS — Z23 Encounter for immunization: Secondary | ICD-10-CM

## 2017-12-29 DIAGNOSIS — G8929 Other chronic pain: Secondary | ICD-10-CM

## 2017-12-29 HISTORY — DX: Pain in right knee: M25.561

## 2017-12-29 LAB — POCT GLYCOSYLATED HEMOGLOBIN (HGB A1C): HbA1c, POC (controlled diabetic range): 8 % — AB (ref 0.0–7.0)

## 2017-12-29 MED ORDER — EMPAGLIFLOZIN 10 MG PO TABS: 10.0000 mg | ORAL_TABLET | Freq: Every day | ORAL | 0 refills | Status: DC

## 2017-12-29 MED ORDER — DICLOFENAC SODIUM 1 % TD GEL: 2.0000 g | Freq: Four times a day (QID) | TRANSDERMAL | 1 refills | Status: AC

## 2017-12-29 NOTE — Assessment & Plan Note (Addendum)
Previously did not tolerate gabapentin, couldn't afford Lyrica. Advised to control sugars with lifestyle changes with diet and exercise. Denied tobacco use. Could consider TCA at next visit.

## 2017-12-29 NOTE — Assessment & Plan Note (Addendum)
States pain is on the surface, worse after working, denies h/o arthritis although noted in problem list. 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 No joint or ligament laxity on exam, no acute trauma. Will trial diclofenac gel and advised continued use of knee pads with added thickening.

## 2017-12-29 NOTE — Patient Instructions (Addendum)
It was great to see you!  Our plans for today:  - I am prescribing an additional medication for your diabetes.  - I am prescribing a pain gel for your knees. - Use extra knee pads when you are at work. - Follow up in one months with Dr. Nelson Chimes for your diabetes. - Please call our nutritionist, Dr. Gerilyn Pilgrim, to set up an appointment. [phone].  Take care and seek immediate care sooner if you develop any concerns.   Dr. [facility]   Diet Recommendations for Diabetes   1. Eat at least 3 meals and 1-2 snacks per day. Never go more than 4-5 hours while awake without eating. Eat breakfast within the first hour of getting up.   2. Limit starchy foods to TWO per meal and ONE per snack. ONE portion of a starchy  food is equal to the following:   - ONE slice of bread (or its equivalent, such as half of a hamburger bun).   - 1/2 cup of a "scoopable" starchy food such as potatoes or rice.   - 15 grams of Total Carbohydrate as shown on food label.  3. Include at every meal: a protein food, a carb food, and vegetables and/or fruit.   - Obtain twice the volume of vegetables as protein or carbohydrate foods for both lunch and dinner.   - Fresh or frozen vegetables are best.   - Keep frozen vegetables on hand for a quick vegetable serving.       Starchy (carb) foods: Bread, rice, pasta, potatoes, corn, cereal, grits, crackers, bagels, muffins, all baked goods.  (Fruits, milk, and yogurt also have carbohydrate, but most of these foods will not spike your blood sugar as most starchy foods will.)  A few fruits do cause high blood sugars; use small portions of bananas (limit to 1/2 at a time), grapes, watermelon, oranges, and most tropical fruits.    Protein foods: Meat, fish, poultry, eggs, dairy foods, and beans such as pinto and kidney beans (beans also provide carbohydrate).       Here is an example of what a healthy plate looks like:    ? Make half your plate fruits and  vegetables.     ? Focus on whole fruits.     ? Vary your veggies.  ? Make half your grains whole grains. -     ? Look for the word "whole" at the beginning of the ingredients list    ? Some whole-grain ingredients include whole oats, whole-wheat flour,        whole-grain corn, whole-grain brown rice, and whole rye.  ? Move to low-fat and fat-free milk or yogurt.  ? Vary your protein routine. - Meat, fish, poultry (chicken, [place]), eggs, beans (kidney, pinto), dairy.  ? Drink and eat less sodium, saturated fat, and added sugars.

## 2017-12-29 NOTE — Assessment & Plan Note (Signed)
Worsened, A1c 8.0 today. Extensive counseling provided regarding diabetic diet and decreasing carbs. Referral made to nutrition. Given intolerance to trulicity, will prescribe Jardiance. Foot exam completed today, normal. F/u with PCP in one month with CBGs.

## 2018-01-10 ENCOUNTER — Encounter: Payer: 59 | Attending: Family Medicine | Admitting: *Deleted

## 2018-01-10 DIAGNOSIS — E1165 Type 2 diabetes mellitus with hyperglycemia: Secondary | ICD-10-CM

## 2018-01-10 DIAGNOSIS — Z713 Dietary counseling and surveillance: Secondary | ICD-10-CM | POA: Diagnosis not present

## 2018-01-10 NOTE — Progress Notes (Signed)
Diabetes Self-Management Education  Visit Type: First/Initial  Appt. Start Time: 1530 Appt. End Time: 1630  01/10/2018  Mr. James Pratt, identified by name and date of birth, is a 47 y.o. male with a diagnosis of Diabetes: Type 2. Patient speaks [place] and [place], so information provided in both languages. 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 He is currently checking his BG fasting and after his dinner meal which is typically by 6 PM.   ASSESSMENT  There were no vitals taken for this visit. There is no height or weight on file to calculate BMI.  Diabetes Self-Management Education - 01/10/18 1527      Visit Information   Visit Type  First/Initial      Initial Visit   Diabetes Type  Type 2    Are you currently following a meal plan?  No    Are you taking your medications as prescribed?  Yes    Date Diagnosed  2008      Health Coping   How would you rate your overall health?  Fair      Psychosocial Assessment   Patient Belief/Attitude about Diabetes  Motivated to manage diabetes    Self-care barriers  English as a second language    Other persons present  Patient    Patient Concerns  Nutrition/Meal planning;Glycemic Control    Special Needs  Simplified materials    Preferred Learning Style  No preference indicated    Learning Readiness  Change in progress    How often do you need to have someone help you when you read instructions, pamphlets, or other written materials from your doctor or pharmacy?  4 - Often    What is the last grade level you completed in school?  6th      Pre-Education Assessment   Patient understands the diabetes disease and treatment process.  Needs Review    Patient understands incorporating nutritional management into lifestyle.  Needs Instruction    Patient undertands incorporating physical activity into lifestyle.  Needs Review    Patient understands using medications safely.  Needs Review    Patient understands  monitoring blood glucose, interpreting and using results  Needs Review    Patient understands prevention, detection, and treatment of acute complications.  Needs Review    Patient understands prevention, detection, and treatment of chronic complications.  Needs Review    Patient understands how to develop strategies to address psychosocial issues.  Needs Review    Patient understands how to develop strategies to promote health/change behavior.  Needs Review      Complications   Last HgB A1C per patient/outside source  8 %    How often do you check your blood sugar?  1-2 times/day    Fasting Blood glucose range (mg/dL)  161-096    Postprandial Blood glucose range (mg/dL)  045-409    Number of hypoglycemic episodes per month  0    Have you had a dilated eye exam in the past 12 months?  Yes    Have you had a dental exam in the past 12 months?  No    Are you checking your feet?  Yes    How many days per week are you checking your feet?  2      Dietary Intake   Breakfast  biscuit with sausage or country ham and egg OR toasted sandwich with eggs    Lunch  eats out - fish and shrimp with  hush puppies, corn on cob, sweet potato casserole OR other sit down meal OR hearty soup with grilled cheese    Snack (afternoon)  occasionally PNB crackers    Dinner  meat, 5-6 tortillas often, beans often, rice occasionally    Snack (evening)  crackers OR sunflower seeds OR chips    Beverage(s)  decaf coffee OR cinnamon tea OR water OR zero gatorade OR occasionally diet soda, unsweet tea with sweetener      Exercise   Exercise Type  Light (walking / raking leaves)   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  7    How many minutes per day do you exercise?  60    Total minutes per week of exercise  420      Patient Education   Previous Diabetes Education  No    Disease state   Definition of diabetes, type 1 and 2, and the diagnosis of diabetes    Nutrition management   Role of diet in the  treatment of diabetes and the relationship between the three main macronutrients and blood glucose level;Carbohydrate counting;Reviewed blood glucose goals for pre and post meals and how to evaluate the patients' food intake on their blood glucose level.;Food label reading, portion sizes and measuring food.    Physical activity and exercise   Role of exercise on diabetes management, blood pressure control and cardiac health.    Medications  Reviewed patients medication for diabetes, action, purpose, timing of dose and side effects.    Monitoring  Purpose and frequency of SMBG.;Identified appropriate SMBG and/or A1C goals.    Acute complications  Taught treatment of hypoglycemia - the 15 rule.    Chronic complications  Relationship between chronic complications and blood glucose control    Psychosocial adjustment  Role of stress on diabetes      Individualized Goals (developed by patient)   Nutrition  Follow meal plan discussed    Physical Activity  Exercise 3-5 times per week    Medications  take my medication as prescribed    Monitoring   test blood glucose pre and post meals as discussed      Post-Education Assessment   Patient understands the diabetes disease and treatment process.  Demonstrates understanding / competency    Patient understands incorporating nutritional management into lifestyle.  Demonstrates understanding / competency    Patient undertands incorporating physical activity into lifestyle.  Demonstrates understanding / competency    Patient understands using medications safely.  Demonstrates understanding / competency    Patient understands monitoring blood glucose, interpreting and using results  Demonstrates understanding / competency    Patient understands prevention, detection, and treatment of acute complications.  Demonstrates understanding / competency    Patient understands prevention, detection, and treatment of chronic complications.  Demonstrates understanding /  competency    Patient understands how to develop strategies to address psychosocial issues.  Demonstrates understanding / competency    Patient understands how to develop strategies to promote health/change behavior.  Demonstrates understanding / competency      Outcomes   Expected Outcomes  Demonstrated interest in learning. Expect positive outcomes    Future DMSE  PRN    Program Status  Completed       Individualized Plan for Diabetes Self-Management Training:   Learning Objective:  Patient will have a greater understanding of diabetes self-management. Patient education plan is to attend individual and/or group sessions per assessed needs and concerns.   Plan:  Patient Instructions  Plan:   Aim for 4 Carb Choices per meal (60 grams) +/- 1 either way   Aim for 0-2 Carbs per snack if hungry   Consider having a snack at night (1-2 Carbs and some protein) at bedtime and see if that helps bring your morning BG down.  Include protein in moderation with your meals and snacks  Consider reading food labels for Total Carbohydrate of foods  Continue with your activity level by walking and working daily as tolerated  Continue checking BG at alternate times per day as directed by MD   Continue taking medication as directed by MD  Expected Outcomes:  Demonstrated interest in learning. Expect positive outcomes  Education material provided: ADA Diabetes: Your Take Control Guide, A1C conversion sheet, Meal plan card and Carbohydrate counting sheet  If problems or questions, patient to contact team via:  Phone  Future DSME appointment: PRN

## 2018-01-10 NOTE — Patient Instructions (Signed)
Plan:   Aim for 4 Carb Choices per meal (60 grams) +/- 1 either way   Aim for 0-2 Carbs per snack if hungry   Consider having a snack at night (1-2 Carbs and some protein) at bedtime and see if that helps bring your morning BG down.  Include protein in moderation with your meals and snacks  Consider reading food labels for Total Carbohydrate of foods  Continue with your activity level by walking and working daily as tolerated  Continue checking BG at alternate times per day as directed by MD   Continue taking medication as directed by MD

## 2018-01-29 ENCOUNTER — Ambulatory Visit: Payer: 59 | Admitting: Family Medicine

## 2018-01-29 NOTE — Progress Notes (Deleted)
   Subjective:   Patient ID: James Pratt    DOB: 06/29/1970, 47 y.o. male   MRN: [id number]  CC: ***   HPI: James Pratt is a 47 y.o. male who presents to clinic today ***. Problems discussed today are as follows:  ROS: See HPI for pertinent ROS.  PMFSH: Pertinent past medical, surgical, family, and social history were reviewed and updated as appropriate. Smoking status reviewed.  Medications reviewed. Objective:   There were no vitals taken for this visit. Vitals and nursing note reviewed.  General: well nourished, well developed, in no acute distress with non-toxic appearance HEENT: NCAT, EOMI, PERRL, MMM, oropharynx clear Neck: supple, non-tender, normal ROM, no LAD  CV: RRR no MRG  Lungs: CTAB, normal effort  Abdomen: soft, NTND, no masses or organomegaly, +bs  Skin: warm, dry, no rashes or lesions, cap refill < 2 seconds Extremities: warm and well perfused, normal tone Neuro: alert, oriented x3, no focal deficits   Assessment & Plan:   No problem-specific Assessment & Plan notes found for this encounter.  No orders of the defined types were placed in this encounter.  No orders of the defined types were placed in this encounter.   James MarchYashika Taelyn Broecker, MD [facility], PGY-3 01/29/2018 2:29 PM

## 2018-02-20 ENCOUNTER — Other Ambulatory Visit: Payer: Self-pay | Admitting: Family Medicine

## 2018-02-20 DIAGNOSIS — E1165 Type 2 diabetes mellitus with hyperglycemia: Principal | ICD-10-CM

## 2018-02-20 DIAGNOSIS — IMO0001 Reserved for inherently not codable concepts without codable children: Secondary | ICD-10-CM

## 2019-02-06 ENCOUNTER — Other Ambulatory Visit: Payer: Self-pay | Admitting: Family Medicine

## 2019-02-22 ENCOUNTER — Telehealth (INDEPENDENT_AMBULATORY_CARE_PROVIDER_SITE_OTHER): Payer: 59 | Admitting: Family Medicine

## 2019-02-22 ENCOUNTER — Other Ambulatory Visit: Payer: Self-pay

## 2019-02-22 DIAGNOSIS — E119 Type 2 diabetes mellitus without complications: Secondary | ICD-10-CM

## 2019-02-22 MED ORDER — GLUCOSE BLOOD VI STRP
ORAL_STRIP | 12 refills | Status: AC
Start: 2019-02-22 — End: ?

## 2019-02-22 NOTE — Progress Notes (Addendum)
Patient was initially scheduled for an office visit to discuss subacute chest pains and a nodule on his wrist, however due to recent Covid positive family members in his household his appointment was transitioned to a virtual meeting.  When I called the patient and his wife at 4:10 PM, they had already left the clinic and frustrated that he was not able to be seen in person.  Remainder of conversation was spent with his wife.  Discussed that I would do my best to discuss his concerns over the phone and assess next steps to move forward with, however did not wish to speak on the phone or discuss his problems.  Offered that we be more than happy to see him on Monday, however declined and did not want to be seen in our clinic in the future.  Recommended proceeding with urgent care evaluation tonight, however stated he was too frustrated and did not want to see a doctor today anymore.  Apologized for miscommunications and resultant frustration he experienced.  At the end of our conversation, his wife noted he has been trying to get glucose test strips refilled for quite some time, but as we were no longer listed as his primary care provider since 03/2018 it had not been coming through.  Refilled his glucose test strips.   Patriciaann Clan, DO

## 2019-02-23 ENCOUNTER — Emergency Department (HOSPITAL_COMMUNITY): Payer: 59

## 2019-02-23 ENCOUNTER — Encounter (HOSPITAL_COMMUNITY): Payer: Self-pay | Admitting: Emergency Medicine

## 2019-02-23 ENCOUNTER — Emergency Department (HOSPITAL_COMMUNITY)
Admission: EM | Admit: 2019-02-23 | Discharge: 2019-02-23 | Disposition: A | Discharge status: Home / Self Care | Payer: 59 | Attending: Emergency Medicine | Admitting: Emergency Medicine

## 2019-02-23 ENCOUNTER — Other Ambulatory Visit: Payer: Self-pay

## 2019-02-23 DIAGNOSIS — Z7984 Long term (current) use of oral hypoglycemic drugs: Secondary | ICD-10-CM | POA: Insufficient documentation

## 2019-02-23 DIAGNOSIS — Z87891 Personal history of nicotine dependence: Secondary | ICD-10-CM | POA: Insufficient documentation

## 2019-02-23 DIAGNOSIS — E119 Type 2 diabetes mellitus without complications: Secondary | ICD-10-CM | POA: Diagnosis not present

## 2019-02-23 DIAGNOSIS — Z79899 Other long term (current) drug therapy: Secondary | ICD-10-CM | POA: Diagnosis not present

## 2019-02-23 DIAGNOSIS — R0789 Other chest pain: Secondary | ICD-10-CM

## 2019-02-23 LAB — BASIC METABOLIC PANEL
Anion gap: 8 (ref 5–15)
BUN: 10 mg/dL (ref 6–20)
CO2: 24 mmol/L (ref 22–32)
Calcium: 8.6 mg/dL — ABNORMAL LOW (ref 8.9–10.3)
Chloride: 103 mmol/L (ref 98–111)
Creatinine, Ser: 0.81 mg/dL (ref 0.61–1.24)
GFR calc Af Amer: >60 mL/min (ref >60)
GFR calc non Af Amer: >60 mL/min (ref >60)
Glucose, Bld: 224 mg/dL — ABNORMAL HIGH (ref 70–99)
Potassium: 6.1 mmol/L — ABNORMAL HIGH (ref 3.5–5.1)
Sodium: 135 mmol/L (ref 135–145)

## 2019-02-23 LAB — CBC WITH DIFFERENTIAL/PLATELET
Abs Immature Granulocytes: 0.01 10*3/uL (ref 0.00–0.07)
Basophils Absolute: 0 10*3/uL (ref 0.0–0.1)
Basophils Relative: 0 %
Eosinophils Absolute: 0.1 10*3/uL (ref 0.0–0.5)
Eosinophils Relative: 2 %
HCT: 39.5 % (ref 39.0–52.0)
Hemoglobin: 13.4 g/dL (ref 13.0–17.0)
Immature Granulocytes: 0 %
Lymphocytes Relative: 38 %
Lymphs Abs: 1.8 10*3/uL (ref 0.7–4.0)
MCH: 30.3 pg (ref 26.0–34.0)
MCHC: 33.9 g/dL (ref 30.0–36.0)
MCV: 89.4 fL (ref 80.0–100.0)
Monocytes Absolute: 0.4 10*3/uL (ref 0.1–1.0)
Monocytes Relative: 9 %
Neutro Abs: 2.4 10*3/uL (ref 1.7–7.7)
Neutrophils Relative %: 51 %
Platelets: 177 10*3/uL (ref 150–400)
RBC: 4.42 MIL/uL (ref 4.22–5.81)
RDW: 12.4 % (ref 11.5–15.5)
WBC: 4.7 10*3/uL (ref 4.0–10.5)
nRBC: 0 % (ref 0.0–0.2)

## 2019-02-23 LAB — TROPONIN I (HIGH SENSITIVITY): Troponin I (High Sensitivity): <2 ng/L (ref <18)

## 2019-02-23 MED ORDER — IBUPROFEN 400 MG PO TABS
600.0000 mg | ORAL_TABLET | Freq: Once | ORAL | Status: AC
  Administered 2019-02-23: 600 mg via ORAL
  Filled 2019-02-23: qty 1

## 2019-02-23 NOTE — ED Triage Notes (Signed)
Pt arrives to ED with left sided cp that he states has been ongoing for 1 month- denies any sob, syncope or dizziness. Pt states what scared him is yesterday he was sleeping and woke up due to "feeling like this heart had stooped". Pt currently has 3/10 pain on left side that feels like a "steady pain" seems worse when at rest.

## 2019-02-23 NOTE — ED Notes (Signed)
Endorses left arm pain that began prior to CP. Pt last ate at 1100, takes metformin as only home medication, prepradial runs between 130 and 200.  Denies SOB.

## 2019-02-23 NOTE — ED Notes (Signed)
Endorses dizziness "when I first wake up" and get out of bed.No changes in pain caused pt to present to ED, has been constant for past mo.

## 2019-02-23 NOTE — ED Provider Notes (Signed)
[facility] DEPARTMENT Provider Note   CSN: [id number] Arrival date & time: 02/23/19  1134     History Chief Complaint  Patient presents with  . Chest Pain    James Pratt is a 48 y.o. male w PMHx alcohol abuse, HLD, T2DM, presenting to the ED with complaint of intermittent left sided chest pain that has been occurring for 3 weeks. He reports he has no issues when he is at work exerting himself, however once he gets home and lays down to watch TV, he will have left sided aching chest pain. He has no assoc SOB, nausea or diaphoresis. He does have a hx of GERD, however states it feels different. No known cardiac hx. Denies hx tobacco use or HTN. He has been asymptomatic since last night.  The history is provided by the patient.       Past Medical History:  Diagnosis Date  . Diabetes mellitus without complication [facility])     Patient Active Problem List   Diagnosis Date Noted  . Bilateral knee pain 12/29/2017  . Contusion of toenail 05/08/2017  . Orthostatic hypotension 10/03/2016  . Polyneuropathy 09/30/2016  . Hypercholesteremia 06/25/2014  . Arthritis 06/25/2014  . Onychomycosis 06/25/2014  . TOBACCO USER 01/17/2009  . Diabetes mellitus type II, uncontrolled ([place]) 12/18/2008  . ALCOHOL ABUSE 12/18/2008  . CANNABIS ABUSE 12/18/2008  . BLINDNESS, LEFT EYE 12/18/2008  . PTERYGIUM 12/18/2008  . MICROALBUMINURIA 12/18/2008    No past surgical history on file.     No family history on file.  Social History   Tobacco Use  . Smoking status: Former Smoker    Packs/day: 0.25    Types: Cigars    Quit date: 10/2017    Years since quitting: 1.3  . Smokeless tobacco: Never Used  Substance Use Topics  . Alcohol use: No    Alcohol/week: 0.0 standard drinks  . Drug use: No    Home Medications Prior to Admission medications   Medication Sig Start Date End Date Taking? Authorizing Provider  acetaminophen (TYLENOL) 500 MG tablet Take  1,000 mg by mouth every 6 (six) hours as needed for moderate pain.    [provider]  atorvastatin (LIPITOR) 10 MG tablet TAKE ONE TABLET EACH DAY 06/15/16   Lovenia Kim, MD  atorvastatin (LIPITOR) 40 MG tablet Take 1 tablet (40 mg total) by mouth daily. Patient taking differently: Take 20 mg by mouth daily.  10/06/16   Zenia Resides, MD  azithromycin (ZITHROMAX) 250 MG tablet Take 1 tablet (250 mg total) by mouth daily. Take first 2 tablets together, then 1 every day until finished. 04/14/17   Tasia Catchings, Amy V, PA-C  benzonatate (TESSALON) 100 MG capsule Take 1 capsule (100 mg total) by mouth every 8 (eight) hours. 04/14/17   Tasia Catchings, Amy V, PA-C  dorzolamide-timolol (COSOPT) 22.3-6.8 MG/ML ophthalmic solution Place 1 drop into the left eye daily. 10/13/11   [provider]  Dulaglutide (TRULICITY) 9.73 ZH/2.9JM SOPN Inject 0.75 mg into the skin once a week. Patient not taking: Reported on 01/10/2018 11/10/16   Zenia Resides, MD  empagliflozin (JARDIANCE) 10 MG TABS tablet Take 10 mg by mouth daily. Patient not taking: Reported on 01/10/2018 12/29/17   Rory Percy, DO  fluticasone [facility]) 50 MCG/ACT nasal spray Place 2 sprays into both nostrils daily. 04/14/17   Tasia Catchings, Amy V, PA-C  glucose blood (ONE TOUCH ULTRA TEST) test strip Dispense QS for twice daily testing 02/22/19   Darrelyn Hillock  N, DO  ibuprofen (ADVIL,MOTRIN) 200 MG tablet Take 800 mg by mouth every 6 (six) hours as needed for moderate pain.    [provider]  ipratropium (ATROVENT) 0.06 % nasal spray Place 2 sprays into both nostrils 4 (four) times daily. 04/14/17   Cathie HoopsYu, Amy V, PA-C  metFORMIN (GLUCOPHAGE) 500 MG tablet TAKE TWO TABLETS TWICE DAILY WITH A MEAL 02/20/18   Freddrick MarchAmin, Yashika, MD  oxyCODONE-acetaminophen (PERCOCET/ROXICET) 5-325 MG tablet Take 1 tablet by mouth every 4 (four) hours as needed for severe pain. 11/14/17   Elpidio AnisUpstill, Shari, PA-C  pantoprazole (PROTONIX) 40 MG tablet Take 1 tablet (40 mg total) by mouth  daily. 06/19/17   Cathren LaineSteinl, Kevin, MD  pregabalin (LYRICA) 75 MG capsule Take 1 capsule (75 mg total) by mouth 2 (two) times daily. Patient not taking: Reported on 10/06/2016 09/30/16   Berton BonMikell, Asiyah Zahra, MD  traMADol (ULTRAM) 50 MG tablet Take 1 tablet (50 mg total) by mouth every 6 (six) hours as needed. 06/19/17   Cathren LaineSteinl, Kevin, MD    Allergies    Meloxicam  Review of Systems   Review of Systems  All other systems reviewed and are negative.   Physical Exam Updated Vital Signs BP 121/69   Pulse 64   Temp 98.8 F (37.1 C) (Oral)   Resp 14   SpO2 100%   Physical Exam Vitals and nursing note reviewed.  Constitutional:      General: He is not in acute distress.    Appearance: He is well-developed.  HENT:     Head: Normocephalic and atraumatic.  Eyes:     Conjunctiva/sclera: Conjunctivae normal.  Cardiovascular:     Rate and Rhythm: Normal rate and regular rhythm.     Heart sounds: Normal heart sounds.  Pulmonary:     Effort: Pulmonary effort is normal. No respiratory distress.     Breath sounds: Normal breath sounds.     Comments: Left Anterior chest wall TTP, reproduces pain and pt states the pain is even worse with palpation Chest:     Chest wall: Tenderness present.  Abdominal:     General: Bowel sounds are normal.     Palpations: Abdomen is soft.     Tenderness: There is no abdominal tenderness. There is no guarding or rebound.  Musculoskeletal:     Right lower leg: No edema.     Left lower leg: No edema.  Skin:    General: Skin is warm.  Neurological:     Mental Status: He is alert.  Psychiatric:        Behavior: Behavior normal.     ED Results / Procedures / Treatments   Labs (all labs ordered are listed, but only abnormal results are displayed) Labs Reviewed  BASIC METABOLIC PANEL - Abnormal; Notable for the following components:      Result Value   Potassium 6.1 (*)    Glucose, Bld 224 (*)    Calcium 8.6 (*)    All other components within normal  limits  CBC WITH DIFFERENTIAL/PLATELET  TROPONIN I (HIGH SENSITIVITY)    EKG EKG Interpretation  Date/Time:  Saturday February 23 2019 11:38:10 EST Ventricular Rate:  69 PR Interval:  150 QRS Duration: 86 QT Interval:  390 QTC Calculation: 417 R Axis:   99 Text Interpretation: Normal sinus rhythm Rightward axis Borderline ECG Confirmed by Geoffery LyonseLo, Douglas ([id number]) on 02/23/2019 12:51:56 PM   Radiology DG Chest 2 View  Result Date: 02/23/2019 CLINICAL DATA:  Left-sided chest pain EXAM: CHEST -  2 VIEW COMPARISON:  None FINDINGS: Cardiomediastinal contours are normal. Lungs are clear. No signs of pleural effusion. No acute bone finding. IMPRESSION: No active cardiopulmonary disease. Electronically Signed   By: Donzetta Kohut M.D.   On: 02/23/2019 12:40    Procedures Procedures (including critical care time)  Medications Ordered in ED Medications  ibuprofen (ADVIL) tablet 600 mg (600 mg Oral Given 02/23/19 1426)    ED Course  I have reviewed the triage vital signs and the nursing notes.  Pertinent labs & imaging results that were available during my care of the patient were reviewed by me and considered in my medical decision making (see chart for details).  Clinical Course as of Feb 23 1431  Sat Feb 23, 2019  1335 Per lab, blood specimen was quite hemolyzed, likely causing false hyperkalemia.  Potassium(!): 6.1 [JR]    Clinical Course User Index [JR] Journey Ratterman, [place] N, PA-C   MDM Rules/Calculators/A&P                      Pt presenting with chest pain that has been itnermittent over multiple weeks, only occurs at rest. He physically exerts himself at work and symptoms do not occur. He has no known cardiac hx. On exam, chest pain is easily reproducible w palpation and pt even notes palpations causes a more severe pain than what he feels at rest. Last occurrence of pain was last night. Cardiac work up today is reassuring, neg trop. EKG is nonischemic, CXR neg. Labs are  reassuring, metabolic panel with hemolysis mostly likely causing the false hyperkalemia. Do not see need to recheck as pt is not on any medications that would cause effect on potassium.  Chest pain is not likely of cardiac or pulmonary etiology. Believe pt is safe for d/c with PCP follow up. Return precautions discussed, safe for discharge.  Pt discussed with Dr. Judd Lien who agrees with workup and discharge plan.  Discussed results, findings, treatment and follow up. Patient advised of return precautions. Patient verbalized understanding and agreed with plan.  Final Clinical Impression(s) / ED Diagnoses Final diagnoses:  Atypical chest pain    Rx / DC Orders ED Discharge Orders    None       Kedarius Aloisi, [place] N, PA-C 02/23/19 1432    Geoffery Lyons, MD 02/23/19 1510

## 2019-02-23 NOTE — Discharge Instructions (Addendum)
Please read instructions below. °Follow up with your primary care provider regarding your visit today. ° Return to the ER for new or worsening symptoms; including worsening chest pain, shortness of breath, pain that radiates to the arm or neck, pain or shortness of breath worsened with exertion.  ° °

## 2019-02-23 NOTE — ED Notes (Signed)
Patient verbalizes understanding of discharge instructions. Opportunity for questioning and answers were provided. Armband removed by staff, pt discharged from ED to home via POV. A&Ox4. NO medications that might alter ability to drive safely administered.

## 2019-02-25 ENCOUNTER — Other Ambulatory Visit: Payer: Self-pay | Admitting: Family Medicine

## 2019-02-27 ENCOUNTER — Other Ambulatory Visit: Payer: Self-pay | Admitting: Family Medicine

## 2019-02-27 NOTE — Telephone Encounter (Signed)
Spoke with this patient last week. He is no longer a patient within our clinic and will be transitioning to different primary care provider in the near future. Will refill his metformin for 1 month to allow him to establish care.   Patriciaann Clan, DO

## 2019-03-19 ENCOUNTER — Other Ambulatory Visit: Payer: Self-pay

## 2019-03-19 ENCOUNTER — Encounter: Payer: Self-pay | Admitting: Family Medicine

## 2019-03-19 ENCOUNTER — Ambulatory Visit (INDEPENDENT_AMBULATORY_CARE_PROVIDER_SITE_OTHER): Payer: 59 | Admitting: Family Medicine

## 2019-03-19 VITALS — BP 116/72 | HR 78 | Wt 180.4 lb

## 2019-03-19 DIAGNOSIS — Z789 Other specified health status: Secondary | ICD-10-CM

## 2019-03-19 DIAGNOSIS — N529 Male erectile dysfunction, unspecified: Secondary | ICD-10-CM

## 2019-03-19 DIAGNOSIS — Z1211 Encounter for screening for malignant neoplasm of colon: Secondary | ICD-10-CM | POA: Diagnosis not present

## 2019-03-19 DIAGNOSIS — Z0000 Encounter for general adult medical examination without abnormal findings: Secondary | ICD-10-CM

## 2019-03-19 DIAGNOSIS — E7849 Other hyperlipidemia: Secondary | ICD-10-CM

## 2019-03-19 DIAGNOSIS — E1165 Type 2 diabetes mellitus with hyperglycemia: Secondary | ICD-10-CM | POA: Diagnosis not present

## 2019-03-19 DIAGNOSIS — E7800 Pure hypercholesterolemia, unspecified: Secondary | ICD-10-CM | POA: Diagnosis not present

## 2019-03-19 LAB — POCT GLYCOSYLATED HEMOGLOBIN (HGB A1C): HbA1c, POC (controlled diabetic range): 7.9 % — AB (ref 0.0–7.0)

## 2019-03-19 MED ORDER — SHINGRIX 50 MCG/0.5ML IM SUSR: 0.5000 mL | Freq: Once | INTRAMUSCULAR | 0 refills | Status: AC

## 2019-03-19 MED ORDER — SILDENAFIL CITRATE 25 MG PO TABS: 25.0000 mg | ORAL_TABLET | Freq: Every day | ORAL | 0 refills | Status: DC | PRN

## 2019-03-19 MED ORDER — ROSUVASTATIN CALCIUM 5 MG PO TABS: 5.0000 mg | ORAL_TABLET | Freq: Every day | ORAL | 1 refills | Status: DC

## 2019-03-19 NOTE — Progress Notes (Signed)
Subjective:  CC -- Annual Physical; With complaints of erectile dysfunction.  Pt reports he has not been able to have an erection for the past several years.  Married and has a good relationship with his wife.  Still has sexual desire and mental arousal.  This is been very frustrating to him.  Denies any concurrent chest pains worsen with exertion, shortness of breath/dyspnea on exertion, lightheadedness/dizziness, intermittent leg cramping.  No injury to his genital region.  No previous history of MI, peripheral vascular disease, or stroke.  He recently was experiencing chest pains in 02/2019 and presents to the ED for further evaluation, felt to be musculoskeletal in nature.  He has been experience a sensation in quite some time now.  Cardiac risk factors including diabetes, family history, previous tobacco use, hypertriglyceridemia, low HDL.  His brother who he believes was 54 years old recently passed away from an MI.  Due for colonoscopy, flu shot, Tdap, zoster (shringrix) vaccine, HIV/hep C screening.  Last lipid panel in 2015. Works for [employer].   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  Does not exercise outside of work. - Diabetes: yes, last A1c 8 in 12/2017. Today A1c is 7.9.  Appears that he previously was taking Trulicity 0.75 mg and Metformin, however currently is only taking Metformin.  Says he will take 2 sometimes 3 tablets.  Checks his glucose fasting intermittently, normally around 160-180.  Says he did not like Trulicity and does not want to start any injectable therapies.  Previously prescribed Jardiance however never started it due to friends endorsing significant side effects.  Also has been on glipizide in  the past, he cannot remember but feels like he did not tolerate this well.  Denies any polyuria or polydipsia, lightheadedness/dizziness.  Cancer: Colorectal >> Colonoscopy: Needs this Lung >> Tobacco Use: yes, previous smoker states he quit about 3 years ago.  Said he only smoked for a few years. Prostate >> Interested in DRE and/or PSA: no  Skin >> Suspicious lesions: no   Social: Alcohol Use: no  Tobacco Use: no, previous see above Other Drugs: no  Risky Sexual Behavior: no  Depression: no   - PHQ2 score: 0 Support and Life at Home: no   Other: Osteoporosis: no  Zoster Vaccine: Will need this Flu Vaccine: Had earlier in the season Pneumonia Vaccine: yes completed in 2014   ROS-see above.  Past Medical History: Including type 2 diabetes, history of alcohol abuse, previous tobacco/marijuana user.   Medications- reviewed and updated  Objective: BP 116/72    Pulse 78    Wt 180 lb 6.4 oz (81.8 kg)    SpO2 98%    BMI 27.43 kg/m  Gen: NAD, alert, cooperative with exam HEENT: NCAT, EOMI CV: RRR, good S1/S2, no murmur Resp: CTABL, no wheezes, non-labored Abd: Soft, Non Tender, Non Distended Genital Exam: not done Ext: No edema, warm Neuro: Alert and oriented, No gross deficits  Assessment/Plan:  Annual physical exam Reviewed past medical, social history, and current medication regimen.  Reviewed overdue health maintenance and placed referrals for a colonoscopy and diabetic ophthalmology evaluation.  Prescription provided for him to receive his Shingrix vaccination at his pharmacy, UTD otherwise.  Additionally obtained one-time screening of HIV and  hepatitis C.  Diabetes mellitus type II, uncontrolled Suboptimal control with A1c 7.9 today, has been maintaining on Metformin 500 mg BID for the past year.  Provided extensive counseling regarding following a well-balanced diet and increasing physical activity. Discussed increasing his Metformin to 1000 BID over the next 1-2 weeks  and following up in 1 month with CBGs.  Future regimen changes may be quite challenging due to his previous intolerance of Trulicity and refusal to consider any injectable therapies again or SGLT2 inhibitors at this time, will need to have further discussion if his A1c is not at goal on follow-up visits.   Hypercholesteremia Last lipid panel in 2015.  Will repeat panel today.  Previously on atorvastatin, self discontinued due to myalgias.  Due to concurrent diabetes, should be on a moderate intensity statin for primary prevention anyways.  After discussion with patient, will start on a low-dose rosuvastatin 5 mg, to increase to 10 if tolerating well.  Erectile dysfunction of organic origin Chronic.  Suspect vascular etiology with known diabetes.  While he has no additional concerning cardiac symptomatology, given his risk factors and his brother recently passing away due to early CAD/MI, requesting cardiac evaluation. -Start low-dose Viagra 25 mg 1 hour prior to sexual activity, may increase to 50 mg if no improvement and tolerating well, discussed common side effects associated with this medication -Referral to cardiology placed   Patient additionally wanted to also discuss chronic left-sided shoulder pain, however unable to discuss this today.  Recommended follow-up visit to address this within the next several weeks.  Follow-up in approximately 1 month to follow-up with diabetes/new medications discussed above  Darrelyn Hillock, [place] PGY-2  03/22/2019 4:28 PM

## 2019-03-19 NOTE — Patient Instructions (Addendum)
It was wonderful to see you today.  We have gotten some lab work today, I will let you know the results of these in the next several days.  I additionally placed a referral to the stomach doctors for colonoscopy and a referral for the eye doctor.  You should hear from their office's to schedule an appointment in the next several weeks.  Additionally I have prescribed a medication to help with your dysfunction, you may take 1 pill approximately 1 hour prior to any sexual activity.  This can cause some flushing and headache.  I will also place referral to cardiology for further evaluation given your risk factors and family history, you should hear from them soon.  I recommend that you start taking aspirin 81 mg daily and I have also sent in a statin medication called Crestor that will help reduce your risk of heart attacks and strokes in the future.  I sent in a low-dose of 5 mg daily you can continue this for the next 1-2 weeks and if you tolerate it well we can bump up to 2 tablets.  For your diabetes, please take 2 tablets of your 500 mg metformin in the morning and 2 tablets in the evening.  I encourage you to try to stay physically active, such as adding walks with your wife later in the day, and try to get plenty of vegetables into your diet.  Please follow-up in approximately 1 month so we can see how you are doing on your medications.

## 2019-03-20 LAB — LIPID PANEL
Chol/HDL Ratio: 4.3 ratio (ref 0.0–5.0)
Cholesterol, Total: 163 mg/dL (ref 100–199)
HDL: 38 mg/dL — ABNORMAL LOW (ref >39)
LDL Chol Calc (NIH): 78 mg/dL (ref 0–99)
Triglycerides: 286 mg/dL — ABNORMAL HIGH (ref 0–149)
VLDL Cholesterol Cal: 47 mg/dL — ABNORMAL HIGH (ref 5–40)

## 2019-03-20 LAB — HIV ANTIBODY (ROUTINE TESTING W REFLEX): HIV Screen 4th Generation wRfx: NONREACTIVE

## 2019-03-20 LAB — HEPATITIS C ANTIBODY: Hep C Virus Ab: <0.1 s/co ratio (ref 0.0–0.9)

## 2019-03-22 ENCOUNTER — Encounter: Payer: Self-pay | Admitting: Gastroenterology

## 2019-03-22 ENCOUNTER — Encounter: Payer: Self-pay | Admitting: Family Medicine

## 2019-03-22 DIAGNOSIS — Z0000 Encounter for general adult medical examination without abnormal findings: Secondary | ICD-10-CM | POA: Insufficient documentation

## 2019-03-22 DIAGNOSIS — N529 Male erectile dysfunction, unspecified: Secondary | ICD-10-CM | POA: Insufficient documentation

## 2019-03-22 NOTE — Assessment & Plan Note (Signed)
Chronic.  Suspect vascular etiology with known diabetes.  While he has no additional concerning cardiac symptomatology, given his risk factors and his brother recently passing away due to early CAD/MI, requesting cardiac evaluation. -Start low-dose Viagra 25 mg 1 hour prior to sexual activity, may increase to 50 mg if no improvement and tolerating well, discussed common side effects associated with this medication -Referral to cardiology placed

## 2019-03-22 NOTE — Assessment & Plan Note (Signed)
Reviewed past medical, social history, and current medication regimen.  Reviewed overdue health maintenance and placed referrals for a colonoscopy and diabetic ophthalmology evaluation.  Prescription provided for him to receive his Shingrix vaccination at his pharmacy, UTD otherwise.  Additionally obtained one-time screening of HIV and hepatitis C.

## 2019-03-22 NOTE — Assessment & Plan Note (Addendum)
Suboptimal control with A1c 7.9 today, has been maintaining on Metformin 500 mg BID for the past year.  Provided extensive counseling regarding following a well-balanced diet and increasing physical activity. Discussed increasing his Metformin to 1000 BID over the next 1-2 weeks and following up in 1 month with CBGs.  Future regimen changes may be quite challenging due to his previous intolerance of Trulicity and refusal to consider any injectable therapies again or SGLT2 inhibitors at this time, will need to have further discussion if his A1c is not at goal on follow-up visits.

## 2019-03-22 NOTE — Assessment & Plan Note (Signed)
Last lipid panel in 2015.  Will repeat panel today.  Previously on atorvastatin, self discontinued due to myalgias.  Due to concurrent diabetes, should be on a moderate intensity statin for primary prevention anyways.  After discussion with patient, will start on a low-dose rosuvastatin 5 mg, to increase to 10 if tolerating well.

## 2019-04-01 ENCOUNTER — Ambulatory Visit: Payer: 59 | Admitting: Cardiovascular Disease

## 2019-04-02 ENCOUNTER — Other Ambulatory Visit: Payer: Self-pay

## 2019-04-02 ENCOUNTER — Ambulatory Visit (AMBULATORY_SURGERY_CENTER): Payer: Self-pay | Admitting: *Deleted

## 2019-04-02 VITALS — Temp 97.3°F | Ht 68.0 in | Wt 178.0 lb

## 2019-04-02 DIAGNOSIS — Z1211 Encounter for screening for malignant neoplasm of colon: Secondary | ICD-10-CM

## 2019-04-02 DIAGNOSIS — Z01818 Encounter for other preprocedural examination: Secondary | ICD-10-CM

## 2019-04-02 MED ORDER — SUPREP BOWEL PREP KIT 17.5-3.13-1.6 GM/177ML PO SOLN: 1.0000 | Freq: Once | ORAL | 0 refills | Status: AC

## 2019-04-02 NOTE — Progress Notes (Signed)
No egg or soy allergy known to patient  No issues with past sedation with any surgeries  or procedures, no intubation problems  No diet pills per patient No home 02 use per patient  No blood thinners per patient  Pt denies issues with constipation  No A fib or A flutter  EMMI video sent to pt's e mail  Suprep $15 coupon   02-23-2019 pt went to the ED for chest pain- cardiac work up was negative- pt denies chest pain, shortness of breathe, no issues - PCP referred to [facility]  Pt states he has no problems and doesn't feel the need to see Cardiology   Due to the COVID-19 pandemic we are asking patients to follow these guidelines. Please only bring one care partner. Please be aware that your care partner may wait in the car in the parking lot or if they feel like they will be too hot to wait in the car, they may wait in the lobby on the 4th floor. All care partners are required to wear a mask the entire time (we do not have any that we can provide them), they need to practice social distancing, and we will do a Covid check for all patient's and care partners when you arrive. Also we will check their temperature and your temperature. If the care partner waits in their car they need to stay in the parking lot the entire time and we will call them on their cell phone when the patient is ready for discharge so they can bring the car to the front of the building. Also all patient's will need to wear a mask into building.

## 2019-04-04 ENCOUNTER — Other Ambulatory Visit: Payer: Self-pay | Admitting: Family Medicine

## 2019-04-10 ENCOUNTER — Encounter: Payer: Self-pay | Admitting: Gastroenterology

## 2019-04-11 ENCOUNTER — Other Ambulatory Visit: Payer: Self-pay

## 2019-04-11 ENCOUNTER — Ambulatory Visit (INDEPENDENT_AMBULATORY_CARE_PROVIDER_SITE_OTHER): Payer: 59

## 2019-04-11 ENCOUNTER — Other Ambulatory Visit: Payer: Self-pay | Admitting: Gastroenterology

## 2019-04-11 DIAGNOSIS — Z1159 Encounter for screening for other viral diseases: Secondary | ICD-10-CM

## 2019-04-12 LAB — SARS CORONAVIRUS 2 (TAT 6-24 HRS): SARS Coronavirus 2: NEGATIVE

## 2019-04-15 ENCOUNTER — Other Ambulatory Visit: Payer: Self-pay

## 2019-04-15 ENCOUNTER — Encounter: Payer: Self-pay | Admitting: Gastroenterology

## 2019-04-15 ENCOUNTER — Ambulatory Visit (AMBULATORY_SURGERY_CENTER): Payer: 59 | Admitting: Gastroenterology

## 2019-04-15 VITALS — BP 89/54 | HR 67 | Temp 96.9°F | Resp 13 | Ht 68.0 in | Wt 178.0 lb

## 2019-04-15 DIAGNOSIS — Z1211 Encounter for screening for malignant neoplasm of colon: Secondary | ICD-10-CM

## 2019-04-15 MED ORDER — SODIUM CHLORIDE 0.9 % IV SOLN: 500.0000 mL | Freq: Once | INTRAVENOUS | Status: DC

## 2019-04-15 NOTE — Patient Instructions (Signed)
  NORMAL COLONOSCOPY. NEXT COLONOSCOPY IN 10 YEARS.   YOU HAD AN ENDOSCOPIC PROCEDURE TODAY AT THE [facility]:   Refer to the procedure report that was given to you for any specific questions about what was found during the examination.  If the procedure report does not answer your questions, please call your gastroenterologist to clarify.  If you requested that your care partner not be given the details of your procedure findings, then the procedure report has been included in a sealed envelope for you to review at your convenience later.  YOU SHOULD EXPECT: Some feelings of bloating in the abdomen. Passage of more gas than usual.  Walking can help get rid of the air that was put into your GI tract during the procedure and reduce the bloating. If you had a lower endoscopy (such as a colonoscopy or flexible sigmoidoscopy) you may notice spotting of blood in your stool or on the toilet paper. If you underwent a bowel prep for your procedure, you may not have a normal bowel movement for a few days.  Please Note:  You might notice some irritation and congestion in your nose or some drainage.  This is from the oxygen used during your procedure.  There is no need for concern and it should clear up in a day or so.  SYMPTOMS TO REPORT IMMEDIATELY:   Following lower endoscopy (colonoscopy or flexible sigmoidoscopy):  Excessive amounts of blood in the stool  Significant tenderness or worsening of abdominal pains  Swelling of the abdomen that is new, acute  Fever of 100F or higher   For urgent or emergent issues, a gastroenterologist can be reached at any hour by calling [phone].   DIET:  We do recommend a small meal at first, but then you may proceed to your regular diet.  Drink plenty of fluids but you should avoid alcoholic beverages for 24 hours.  ACTIVITY:  You should plan to take it easy for the rest of today and you should NOT DRIVE or use heavy machinery until tomorrow  (because of the sedation medicines used during the test).    FOLLOW UP: Our staff will call the number listed on your records 48-72 hours following your procedure to check on you and address any questions or concerns that you may have regarding the information given to you following your procedure. If we do not reach you, we will leave a message.  We will attempt to reach you two times.  During this call, we will ask if you have developed any symptoms of COVID 19. If you develop any symptoms (ie: fever, flu-like symptoms, shortness of breath, cough etc.) before then, please call [phone].  If you test positive for Covid 19 in the 2 weeks post procedure, please call and report this information to [place].    If any biopsies were taken you will be contacted by phone or by letter within the next 1-3 weeks.  Please call us at [phone] if you have not heard about the biopsies in 3 weeks.    SIGNATURES/CONFIDENTIALITY: You and/or your care partner have signed paperwork which will be entered into your electronic medical record.  These signatures attest to the fact that that the information above on your After Visit Summary has been reviewed and is understood.  Full responsibility of the confidentiality of this discharge information lies with you and/or your care-partner.

## 2019-04-15 NOTE — Progress Notes (Signed)
Report to PACU, RN, vss, BBS= Clear.  

## 2019-04-15 NOTE — Progress Notes (Signed)
Pt's states no medical or surgical changes since previsit or office visit.  AG iV,S B vitals and LC temps.

## 2019-04-15 NOTE — Op Note (Signed)
[facility] Patient Name: James Pratt Procedure Date: 04/15/2019 10:03 AM MRN: [id number] Endoscopist: Rachael Fee , MD Age: 49 Referring MD:  Date of Birth: 05-Jan-1971 Gender: Male Account #: [id number] Procedure:                Colonoscopy Indications:              Screening for colorectal malignant neoplasm Medicines:                Monitored Anesthesia Care Procedure:                Pre-Anesthesia Assessment:                           - Prior to the procedure, a History and Physical                            was performed, and patient medications and                            allergies were reviewed. The patient's tolerance of                            previous anesthesia was also reviewed. The risks                            and benefits of the procedure and the sedation                            options and risks were discussed with the patient.                            All questions were answered, and informed consent                            was obtained. Prior Anticoagulants: The patient has                            taken no previous anticoagulant or antiplatelet                            agents. ASA Grade Assessment: II - A patient with                            mild systemic disease. After reviewing the risks                            and benefits, the patient was deemed in                            satisfactory condition to undergo the procedure.                           After obtaining informed consent, the colonoscope  was passed under direct vision. Throughout the                            procedure, the patient's blood pressure, pulse, and                            oxygen saturations were monitored continuously. The                            Colonoscope was introduced through the anus and                            advanced to the the cecum, identified by                            appendiceal orifice and  ileocecal valve. The                            colonoscopy was performed without difficulty. The                            patient tolerated the procedure well. The quality                            of the bowel preparation was good. The ileocecal                            valve, appendiceal orifice, and rectum were                            photographed. Scope In: 10:37:01 AM Scope Out: 10:45:02 AM Scope Withdrawal Time: 0 hours 6 minutes 9 seconds  Total Procedure Duration: 0 hours 8 minutes 1 second  Findings:                 The entire examined colon appeared normal on direct                            and retroflexion views. Complications:            No immediate complications. Estimated blood loss:                            None. Estimated Blood Loss:     Estimated blood loss: none. Impression:               - The entire examined colon is normal on direct and                            retroflexion views.                           - No polyps or cancers. Recommendation:           - Patient has a contact number available for  emergencies. The signs and symptoms of potential                            delayed complications were discussed with the                            patient. Return to normal activities tomorrow.                            Written discharge instructions were provided to the                            patient.                           - Resume previous diet.                           - Continue present medications.                           - Repeat colonoscopy in 10 years for screening. Milus Banister, MD 04/15/2019 10:47:37 AM This report has been signed electronically.

## 2019-04-17 ENCOUNTER — Telehealth: Payer: Self-pay

## 2019-04-17 NOTE — Telephone Encounter (Signed)
  Follow up Call-  Call back number 04/15/2019  Post procedure Call Back phone  # [phone]  Permission to leave phone message Yes  Some recent data might be hidden     Patient questions:  Do you have a fever, pain , or abdominal swelling? No. Pain Score  0 *  Have you tolerated food without any problems? Yes.    Have you been able to return to your normal activities? Yes.    Do you have any questions about your discharge instructions: Diet   No. Medications  No. Follow up visit  No.  Do you have questions or concerns about your Care? Yes.     Patient states he has some soreness at RLQ of abdomen since his procedure. He states he doesn't notice it when he is working, but when he is relaxing at home. He states he hasn't had any difficulty returning to his regular diet and has had a normal BM, no blood noted. Denies any other symptoms. Told patient that some abdominal soreness can occur pos-procedure, but in the absence of other symptoms, please continue to monitor and don't hesitate to call us back immediately if he develops new symptoms or if the soreness doesn't resolve before the end of the week. Pt. States he understands and will do so.  Actions: * If pain score is 4 or above: No action needed, pain <4. 1. Have you developed a fever since your procedure? no  2.   Have you had an respiratory symptoms (SOB or cough) since your procedure? no  3.   Have you tested positive for COVID 19 since your procedure no  4.   Have you had any family members/close contacts diagnosed with the COVID 19 since your procedure?  no   If yes to any of these questions please route to Laverna Peace, RN and Jennye Boroughs, Charity fundraiser.

## 2019-04-17 NOTE — Telephone Encounter (Signed)
I agree completely, thank you

## 2019-04-26 ENCOUNTER — Other Ambulatory Visit: Payer: Self-pay

## 2019-04-26 ENCOUNTER — Ambulatory Visit (INDEPENDENT_AMBULATORY_CARE_PROVIDER_SITE_OTHER): Payer: 59 | Admitting: Family Medicine

## 2019-04-26 ENCOUNTER — Encounter: Payer: Self-pay | Admitting: Family Medicine

## 2019-04-26 VITALS — BP 112/70 | HR 73 | Wt 177.6 lb

## 2019-04-26 DIAGNOSIS — N529 Male erectile dysfunction, unspecified: Secondary | ICD-10-CM | POA: Diagnosis not present

## 2019-04-26 DIAGNOSIS — M25512 Pain in left shoulder: Secondary | ICD-10-CM | POA: Diagnosis not present

## 2019-04-26 DIAGNOSIS — E1165 Type 2 diabetes mellitus with hyperglycemia: Secondary | ICD-10-CM

## 2019-04-26 DIAGNOSIS — G8929 Other chronic pain: Secondary | ICD-10-CM

## 2019-04-26 DIAGNOSIS — E7800 Pure hypercholesterolemia, unspecified: Secondary | ICD-10-CM

## 2019-04-26 DIAGNOSIS — R809 Proteinuria, unspecified: Secondary | ICD-10-CM

## 2019-04-26 MED ORDER — GLIPIZIDE 5 MG PO TABS: 2.5000 mg | ORAL_TABLET | Freq: Two times a day (BID) | ORAL | 1 refills | Status: DC

## 2019-04-26 NOTE — Progress Notes (Signed)
CHIEF COMPLAINT / HPI: Follow-up new medications  James Pratt is a 49 year old gentleman presenting discussed the following:  Diabetes: Seen on 1/12 with an A1c of 7.9 at that time.  Taking 2-3 tablets of Metformin on a daily basis.  Instructed to take a total of 1000 mg BID and intermittently monitor glucose.  Today, reports he has been compliant with taking increase Metformin.  Fasting glucose varying, generally around 180.  He has been trying to eat a little bit better.  Active through work, in [employer].  Previously has tried Surveyor, minerals, does not want to be put on these again due to undesirable side effects.  Not interested in doing any injectables right now.  Previously was on glipizide and felt like he did well with that, does not report any hypoglycemic episodes.  Has previously been prescribed Jardiance however never started taking it because his friend had troubles with it, does not want to try this medicine.  Additionally started Crestor last time, tolerating this well.  Initially had some myalgias however has resolved.  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 Started on Viagra last month, low-dose of 25 mg. Tried 3 times, didn't make a difference. Did not try the 50 mg to see if this would help.  Had a headache once afterwards but denies any lightheadedness/dizziness, syncopal episodes, visual difficulties.   Shoulder pain: Left-sided.  Present for at least the past 4 years, no acute worsening recently but continues to frustrate him.  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  Denies any weakness, numbness, locking, or proceeding injury/trauma.  Seems to feel more pain once he has flexed/abducted his arm over 90 degrees.  Localizes pain on the top of his shoulder around [facility] joint and the top of the shoulder.  No associated chest pain, shortness of breath, diaphoresis.  Does not seem to be provoked by activity.  He was  requesting imaging of his shoulder.  He previously has received shoulder injections due to this pain, says it worked great for 1-2 days but then would go away.  He has not had one in several years.  PERTINENT  PMH / PSH: T2 DM, organic ED, hyperlipidemia  OBJECTIVE: BP 112/70   Pulse 73   Wt 177 lb 9.6 oz (80.6 kg)   SpO2 97%   BMI 27.00 kg/m   General: Alert, NAD HEENT: NCAT, MMM, oropharynx nonerythematous  Cardiac: RRR no m/g/r Lungs: Clear bilaterally, no increased WOB  Abdomen: soft, non-tender, non-distended, normoactive BS Msk: Moves all extremities spontaneously  Ext: Warm, dry, 2+ distal pulses, no edema   Left Shoulder: Inspection reveals no obvious deformity, atrophy, or asymmetry. No bruising. No swelling TTP over AC joint.  Nontender over bicipital groove. Full ROM in flexion, abduction, internal/external rotation-however pain elicited with flexion/abduction over 90 degrees. NV intact distally Normal scapular function observed. Special Tests:  - Impingement: Neg Hawkins, positive neers, empty can sign. - Supraspinatous: Negative empty can.  5/5 strength with resisted flexion at 20 degrees - Infraspinatous/Teres Minor: 5/5 strength with ER - Subscapularis: 5/5 strength with IR - Biceps tendon: Negative Speeds - Labrum: Negative Obriens, good stability - AC Joint: Positive cross arm - No drop arm sign  Right shoulder: No deformities noted.  Nontender to palpation.  Full ROM with 5/5 strength testing.  Diabetic Foot Exam - Simple   Simple Foot Form Diabetic Foot exam was performed with  the following findings: Yes 04/26/2019  4:00 PM  Visual Inspection No deformities, no ulcerations, no other skin breakdown bilaterally: Yes Sensation Testing Intact to touch and monofilament testing bilaterally: Yes Pulse Check Posterior Tibialis and Dorsalis pulse intact bilaterally: Yes Comments     ASSESSMENT / PLAN:  Diabetes mellitus type II, uncontrolled A1c 7.9 in  03/2019.  Suboptimal CBGs at home despite increasing Metformin, limited agents to add due to patient refusal.  Previously tolerated glipizide well in the past, will add low-dose 2.5mg  BID.  Low concern for hypoglycemia given fasting seems to be around 180-200s, however instructed on signs/symptoms and to discontinue if he has few CBGs < 70.  Could consider trialing Rybelsus in the future if insurance would allow, patient is very hesitant/cautious of alternative medications at this point. -Continue Metformin as is -Glipizide 2.5 mg twice daily -Monitor fasting and 2 hours postprandial CBGs -Will obtain microalbumin/creatinine ratio on follow-up due to history of microalbuminuria  -Ophthalmology referral ordered on recent visit, patient awaiting to schedule -Follow-up in approximately 2 months with glucose log or sooner if needed if any concerns, return precautions discussed including irregular CBGs  Erectile dysfunction of organic origin Low-dose ineffective.  Recommended increase to 50 mg, may bump up to a max of 100 mg if tolerable.  Educated on adverse effects related to Viagra including lightheadedness/dizziness 2/2 hypotension, hearing/visual changes, priapism.  Hypercholesteremia LDL 78.  Started on Crestor in 03/2019 for primary prevention in the setting of known diabetes.  Currently on moderate intensity.  MICROALBUMINURIA Recheck urine microalbumin/creatinine ratio on follow-up.  Pending results, could consider starting low-dose ACE/ARB.  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  Discussed conservative care including short trial of naproxen (meloxicam listed as allergy, reports he does well with ibuprofen), ice/heat as needed, topical therapy such as Voltaren/blue emu cream.  Discussed evaluation by sports medicine team after patient requested ultrasound visualization of his  shoulder, could also consider therapeutic shoulder injection at that time as well.  However do note he previously had a quick return of pain (1-2 days) after injection, however it has been several years and would be amenable to retrialing this.  May benefit from PT in the future. -Referral to current sports medicine placed, patient provided with phone number to call -Naproxen for 7 days -Topical therapies and ice/heat discussed above -Avoid aggravating activities as possible    Follow-up in 2 months with A1c or sooner if needed.  Patriciaann Clan, Ama

## 2019-04-26 NOTE — Patient Instructions (Addendum)
It was wonderful seeing you today.  We are going to add on glipizide to help get your sugars under better control.  When she does take a half a tablet in the morning and in the evening, if you have any sugars under 70 then we need to stop this medication.  We can consider trying oral semaglutide, Rybelsus, in the future if your insurance would cover it.  Additionally for your shoulder pain, you can use ice/heat, topical therapies such as Aspercreme, Voltaren gel, or blue emu cream.  This pain is likely from arthritis.  If you do well with NSAIDs other than meloxicam, I will send in naproxen for you to take for 7 days.  I will also place referral to [facility] sports medicine--- this is their phone number that you can call and schedule an appointment for soon [phone]  Please follow-up in 2 months or sooner if needed.

## 2019-04-27 ENCOUNTER — Encounter: Payer: Self-pay | Admitting: Family Medicine

## 2019-04-27 DIAGNOSIS — M25512 Pain in left shoulder: Secondary | ICD-10-CM | POA: Insufficient documentation

## 2019-04-27 MED ORDER — NAPROXEN 500 MG PO TABS: 500.0000 mg | ORAL_TABLET | Freq: Two times a day (BID) | ORAL | 0 refills | Status: AC

## 2019-04-27 NOTE — Assessment & Plan Note (Signed)
LDL 78.  Started on Crestor in 03/2019 for primary prevention in the setting of known diabetes.  Currently on moderate intensity.

## 2019-04-27 NOTE — Assessment & Plan Note (Signed)
Recheck urine microalbumin/creatinine ratio on follow-up.  Pending results, could consider starting low-dose ACE/ARB.

## 2019-04-27 NOTE — Assessment & Plan Note (Addendum)
A1c 7.9 in 03/2019.  Suboptimal CBGs at home despite increasing Metformin, limited agents to add due to patient refusal.  Previously tolerated glipizide well in the past, will add low-dose 2.5mg  BID.  Low concern for hypoglycemia given fasting seems to be around 180-200s, however instructed on signs/symptoms and to discontinue if he has few CBGs < 70.  Could consider trialing Rybelsus in the future if insurance would allow, patient is very hesitant/cautious of alternative medications at this point. -Continue Metformin as is -Glipizide 2.5 mg twice daily -Monitor fasting and 2 hours postprandial CBGs -Will obtain microalbumin/creatinine ratio on follow-up due to history of microalbuminuria  -Ophthalmology referral ordered on recent visit, patient awaiting to schedule -Follow-up in approximately 2 months with glucose log or sooner if needed if any concerns, return precautions discussed including irregular CBGs

## 2019-04-27 NOTE — Assessment & Plan Note (Addendum)
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  Discussed conservative care including short trial of naproxen (meloxicam listed as allergy, reports he does well with ibuprofen), ice/heat as needed, topical therapy such as Voltaren/blue emu cream.  Discussed evaluation by sports medicine team after patient requested ultrasound visualization of his shoulder, could also consider therapeutic shoulder injection at that time as well.  However do note he previously had a quick return of pain (1-2 days) after injection, however it has been several years and would be amenable to retrialing this.  May benefit from PT in the future. -Referral to current sports medicine placed, patient provided with phone number to call -Naproxen for 7 days -Topical therapies and ice/heat discussed above -Avoid aggravating activities as possible

## 2019-04-27 NOTE — Assessment & Plan Note (Signed)
Low-dose ineffective.  Recommended increase to 50 mg, may bump up to a max of 100 mg if tolerable.  Educated on adverse effects related to Viagra including lightheadedness/dizziness 2/2 hypotension, hearing/visual changes, priapism.

## 2019-05-03 ENCOUNTER — Ambulatory Visit: Payer: 59 | Admitting: Cardiovascular Disease

## 2019-05-03 ENCOUNTER — Encounter: Payer: Self-pay | Admitting: Cardiovascular Disease

## 2019-05-03 ENCOUNTER — Other Ambulatory Visit: Payer: Self-pay

## 2019-05-03 VITALS — BP 130/82 | HR 60 | Temp 97.2°F | Ht 68.0 in | Wt 173.0 lb

## 2019-05-03 DIAGNOSIS — E118 Type 2 diabetes mellitus with unspecified complications: Secondary | ICD-10-CM | POA: Diagnosis not present

## 2019-05-03 DIAGNOSIS — E782 Mixed hyperlipidemia: Secondary | ICD-10-CM

## 2019-05-03 DIAGNOSIS — R0789 Other chest pain: Secondary | ICD-10-CM | POA: Diagnosis not present

## 2019-05-03 DIAGNOSIS — R1013 Epigastric pain: Secondary | ICD-10-CM

## 2019-05-03 NOTE — Progress Notes (Signed)
Cardiology Office Note    Date:  05/05/2019   ID:  James Pratt, James Pratt 07-30-1970, MRN [id number]  PCP:  James Clan, DO  Cardiologist:  Shelva Majestic, MD   New evaluation  History of Present Illness:  James Pratt is a 49 y.o. male who is referred by Dr. Darrelyn Pratt for evaluation of left chest aching.  Ms. James Pratt denies any known awareness of cardiac disease.  He is followed at [facility] family practice center.  He has a history of diabetes mellitus since 2009 and has been on glipizide in addition Metformin.  He also has a history of acne, currently on simvastatin.  He does have some issues with erectile dysfunction for which he takes sildenafil.  Recently, he has noticed a vague left chest sensation which he feels like "air is getting trapped."  He then experiences a sharp chest sensation which resolves.  He denies any definite history of exertional precipitation to his chest discomfort.  He does have a history of acid reflux.  Because of his recent symptomatology, he is now referred for evaluation.  He denies any palpitations, PND orthopnea, presyncope or syncope.  He is unaware of any family history for premature cardiac disease.   Past Medical History:  Diagnosis Date  . Bilateral knee pain 12/29/2017  . Blind left eye   . Cataract    forming right eye   . Contusion of toenail 05/08/2017  . Depression    situational after death of brother   . Diabetes mellitus without complication ([place])   . GERD (gastroesophageal reflux disease)    past hx of GERD- denies currently  . Hyperlipidemia   . Orthostatic hypotension 10/03/2016    Past Surgical History:  Procedure Laterality Date  . EYE SURGERY Left     Current Medications: Outpatient Medications Prior to Visit  Medication Sig Dispense Refill  . acetaminophen (TYLENOL) 500 MG tablet Take 1,000 mg by mouth every 6 (six) hours as needed for moderate pain.    . dorzolamide-timolol (COSOPT) 22.3-6.8 MG/ML  ophthalmic solution Place 1 drop into the left eye daily.    . fluticasone (FLONASE) 50 MCG/ACT nasal spray Place 2 sprays into both nostrils daily. 1 g 0  . glipiZIDE (GLUCOTROL) 5 MG tablet Take 0.5 tablets (2.5 mg total) by mouth 2 (two) times daily before a meal. 30 tablet 1  . glucose blood (ONE TOUCH ULTRA TEST) test strip Dispense QS for twice daily testing 100 each 12  . ibuprofen (ADVIL,MOTRIN) 200 MG tablet Take 800 mg by mouth every 6 (six) hours as needed for moderate pain.    Marland Kitchen ipratropium (ATROVENT) 0.06 % nasal spray Place 2 sprays into both nostrils 4 (four) times daily. 15 mL 0  . naproxen (NAPROSYN) 500 MG tablet Take 1 tablet (500 mg total) by mouth 2 (two) times daily with a meal for 7 days. 14 tablet 0  . rosuvastatin (CRESTOR) 5 MG tablet Take 1 tablet (5 mg total) by mouth at bedtime. 60 tablet 1  . sildenafil (VIAGRA) 25 MG tablet Take 1 tablet (25 mg total) by mouth daily as needed for erectile dysfunction. Take 1 hour prior to sexual activity. 10 tablet 0  . metFORMIN (GLUCOPHAGE) 500 MG tablet Take 2 tablets (1,000 mg total) by mouth 2 (two) times daily with a meal. 120 tablet 0   No facility-administered medications prior to visit.     Allergies:   Meloxicam   Social History   Socioeconomic History  .  Marital status: Married    Spouse name: Not on file  . Number of children: Not on file  . Years of education: Not on file  . Highest education level: Not on file  Occupational History  . Not on file  Tobacco Use  . Smoking status: Former Smoker    Packs/day: 0.25    Types: Cigars    Quit date: 10/2017    Years since quitting: 1.5  . Smokeless tobacco: Never Used  Substance and Sexual Activity  . Alcohol use: No    Alcohol/week: 0.0 standard drinks  . Drug use: No  . Sexual activity: Not on file  Other Topics Concern  . Not on file  Social History Narrative  . Not on file   Social Determinants of Health   Financial Resource Strain:   . Difficulty  of Paying Living Expenses: Not on file  Food Insecurity:   . Worried About Charity fundraiser in the Last Year: Not on file  . Ran Out of Food in the Last Year: Not on file  Transportation Needs:   . Lack of Transportation (Medical): Not on file  . Lack of Transportation (Non-Medical): Not on file  Physical Activity:   . Days of Exercise per Week: Not on file  . Minutes of Exercise per Session: Not on file  Stress:   . Feeling of Stress : Not on file  Social Connections:   . Frequency of Communication with Friends and Family: Not on file  . Frequency of Social Gatherings with Friends and Family: Not on file  . Attends Religious Services: Not on file  . Active Member of Clubs or Organizations: Not on file  . Attends Archivist Meetings: Not on file  . Marital Status: Not on file    He was born in [place] and [place].  He has worked in the [employer] History: Family history is notable that mother 80;  father 2.  He has 2 deceased brothers one secondary to EtOH.  He has 2 living brothers and 4 sisters.  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 shattered piece of plywood sticking in his eye at a younger age; No changes in vision or hearing, sinus congestion, difficulty swallowing Pulmonary: Negative; No cough, wheezing, shortness of breath, hemoptysis Cardiovascular: Negative; No chest pain, presyncope, syncope, palpitations GI: Negative; No nausea, vomiting, diarrhea, or abdominal pain GU: Negative; No dysuria, hematuria, or difficulty voiding Musculoskeletal: Negative; no myalgias, joint pain, or weakness Hematologic/Oncology: Negative; no easy bruising, bleeding Endocrine: Positive for diabetes mellitus Neuro: Negative; no changes in balance, headaches Skin: Negative; No rashes or skin lesions Psychiatric: Negative; No behavioral problems, depression Sleep: Negative; No  snoring, daytime sleepiness, hypersomnolence, bruxism, restless legs, hypnogognic hallucinations, no cataplexy Other comprehensive 14 point system review is negative.   PHYSICAL EXAM:   VS:  BP 130/82   Pulse 60   Temp (!) 97.2 F (36.2 C)   Ht '5\' 8"'  (1.727 m)   Wt 173 lb (78.5 kg)   SpO2 99%   BMI 26.30 kg/m     Repeat blood pressure by me 122/70  Wt Readings from Last 3 Encounters:  05/03/19 173 lb (78.5 kg)  04/26/19 177 lb 9.6 oz (80.6 kg)  04/15/19 178 lb (80.7 kg)    General: Alert, oriented, no distress.  Skin: normal turgor, no rashes, warm and dry HEENT: Normocephalic, atraumatic.  Left eye blindness;Nose without nasal septal hypertrophy Mouth/Parynx benign; Mallinpatti scale 3 Neck: No JVD, no carotid bruits; normal carotid upstroke Lungs: clear to ausculatation and percussion; no wheezing or rales Chest wall: without tenderness to palpitation; no costochondral tenderness Heart: PMI not displaced, RRR, s1 s2 normal, 1/6 systolic murmur, no diastolic murmur, no rubs, gallops, thrills, or heaves Abdomen: soft, nontender; no hepatosplenomehaly, BS+; abdominal aorta nontender and not dilated by palpation. Back: no CVA tenderness Pulses 2+ Musculoskeletal: full range of motion, normal strength, no joint deformities Extremities: no clubbing cyanosis or edema, Homan's sign negative  Neurologic: grossly nonfocal; Cranial nerves grossly wnl Psychologic: Normal mood and affect   Studies/Labs Reviewed:   EKG:  EKG is ordered today.  ECG (independently read by me): Low atrial rhythm with inverted P waves in leads II 3 and F.  Mild sinus arrhythmia.  Normal intervals  Recent Labs: BMP Latest Ref Rng & Units 02/23/2019 11/13/2017 06/19/2017  Glucose 70 - 99 mg/dL 224(H) 193(H) 277(H)  BUN 6 - 20 mg/dL '10 10 7  ' Creatinine 0.61 - 1.24 mg/dL 0.81 0.94 0.84  BUN/Creat Ratio 9 - 20 - - -  Sodium 135 - 145 mmol/L 135 139 136  Potassium 3.5 - 5.1 mmol/L 6.1(H) 4.2 3.8  Chloride  98 - 111 mmol/L 103 105 103  CO2 22 - 32 mmol/L '24 27 27  ' Calcium 8.9 - 10.3 mg/dL 8.6(L) 9.2 8.7(L)     Hepatic Function Latest Ref Rng & Units 11/13/2017 06/19/2017 12/03/2014  Total Protein 6.5 - 8.1 g/dL 6.4(L) 6.3(L) 6.3(L)  Albumin 3.5 - 5.0 g/dL 3.7 3.8 3.8  AST 15 - 41 U/L '21 21 27  ' ALT 0 - 44 U/L '16 20 18  ' Alk Phosphatase 38 - 126 U/L 72 84 71  Total Bilirubin 0.3 - 1.2 mg/dL 0.6 0.5 0.8    CBC Latest Ref Rng & Units 02/23/2019 11/13/2017 06/19/2017  WBC 4.0 - 10.5 K/uL 4.7 5.4 4.7  Hemoglobin 13.0 - 17.0 g/dL 13.4 13.0 12.8(L)  Hematocrit 39.0 - 52.0 % 39.5 39.0 37.4(L)  Platelets 150 - 400 K/uL 177 147(L) 127(L)   Lab Results  Component Value Date   MCV 89.4 02/23/2019   MCV 89.7 11/13/2017   MCV 88.4 06/19/2017   No results found for: TSH Lab Results  Component Value Date   HGBA1C 7.9 (A) 03/19/2019     BNP No results found for: BNP  ProBNP No results found for: PROBNP   Lipid Panel     Component Value Date/Time   CHOL 163 03/19/2019 1636   TRIG 286 (H) 03/19/2019 1636   HDL 38 (L) 03/19/2019 1636   CHOLHDL 4.3 03/19/2019 1636   CHOLHDL 4.8 02/10/2014 1045   VLDL 56 (H) 02/10/2014 1045   LDLCALC 78 03/19/2019 1636   LABVLDL 47 (H) 03/19/2019 1636     RADIOLOGY: No results found.   Additional studies/ records that were reviewed today include:  I have reviewed the records from: Family practice center   ASSESSMENT:    1. Chest pain, atypical   2. Mixed hyperlipidemia   3. Dyspepsia   4. Type 2 diabetes mellitus with complication, without long-term current use of insulin [facility])     PLAN:  Mr. Wombles is a 49 year old gentleman who has a history of diabetes mellitus since 2009 and has been on Metformin in addition to glipizide.  He does not have any history of hypertension.  He has been on rosuvastatin for hyperlipidemia.  He denies any exertional  chest pain symptomatology.  Recently he has noticed a vague left-sided chest sensation which he  feels is the result of air getting trapped and then he experiences a sharp discomfort which ultimately self abates.  His symptom complex does not sound ischemic in etiology.  He does not have any chest wall tenderness to palpation to suggest a musculoskeletal etiology.  I question if some of his symptoms are related to air trapping and possible this pack symptomatology.  I have suggested a trial of over-the-counter Pepcid and in addition have suggested a trial of Gas-X to see if they improve his symptoms.  His ECG today showed H rhythm fibrillated played in the inferior leads.  He did not have any ST segment changes.  I reviewed laboratory from December 2019 and troponin was less than 2.  Recent lipid studies have shown cholesterol 163 with LDL of 78 but triglycerides are elevated at 286 with an HDL of 38 and VLDL of 47.  He is on rosuvastatin 5 mg. He would benefit from significant reduction in carbohydrates and sweets and may benefit from initiation of omega-3 fatty acids.  Presently, I do not feel cardiac imaging is necessary based on his current symptoms but with his diabetes at some point can be done in the future.   Medication Adjustments/Labs and Tests Ordered: Current medicines are reviewed at length with the patient today.  Concerns regarding medicines are outlined above.  Medication changes, Labs and Tests ordered today are listed in the Patient Instructions below. Patient Instructions  Medication Instructions:  Pick up OTC pepcid and gas x  *If you need a refill on your cardiac medications before your next appointment, please call your pharmacy*  Testing/Procedures: Your physician has requested that you have an echocardiogram. Echocardiography is a painless test that uses sound waves to create images of your heart. It provides your doctor with information about the size and shape of your heart and how well your heart's chambers and valves are working. This procedure takes approximately one  hour. There are no restrictions for this procedure.   [place]   Follow-Up: At [facility], you and your health needs are our priority.  As part of our continuing mission to provide you with exceptional heart care, we have created designated Provider Care Teams.  These Care Teams include your primary Cardiologist (physician) and Advanced Practice Providers (APPs -  Physician Assistants and Nurse Practitioners) who all work together to provide you with the care you need, when you need it.  We recommend signing up for the patient portal called "MyChart".  Sign up information is provided on this After Visit Summary.  MyChart is used to connect with patients for Virtual Visits (Telemedicine).  Patients are able to view lab/test results, encounter notes, upcoming appointments, etc.  Non-urgent messages can be sent to your provider as well.   To learn more about what you can do with MyChart, go to [email or web address].    Your next appointment:    FOLLOW UP PRN- DEPENDANT ON ECHO.    Signed, Shelva Majestic, MD  05/05/2019 6:32 PM    [facility] [address] [place], [place], [place]  [address] Phone: [phone]

## 2019-05-03 NOTE — Patient Instructions (Signed)
Medication Instructions:  Pick up OTC pepcid and gas x  *If you need a refill on your cardiac medications before your next appointment, please call your pharmacy*  Testing/Procedures: Your physician has requested that you have an echocardiogram. Echocardiography is a painless test that uses sound waves to create images of your heart. It provides your doctor with information about the size and shape of your heart and how well your heart's chambers and valves are working. This procedure takes approximately one hour. There are no restrictions for this procedure.   [address]   Follow-Up: At [facility], you and your health needs are our priority.  As part of our continuing mission to provide you with exceptional heart care, we have created designated Provider Care Teams.  These Care Teams include your primary Cardiologist (physician) and Advanced Practice Providers (APPs -  Physician Assistants and Nurse Practitioners) who all work together to provide you with the care you need, when you need it.  We recommend signing up for the patient portal called "MyChart".  Sign up information is provided on this After Visit Summary.  MyChart is used to connect with patients for Virtual Visits (Telemedicine).  Patients are able to view lab/test results, encounter notes, upcoming appointments, etc.  Non-urgent messages can be sent to your provider as well.   To learn more about what you can do with MyChart, go to [email or web address].    Your next appointment:    FOLLOW UP PRN- DEPENDANT ON ECHO.

## 2019-05-05 ENCOUNTER — Encounter: Payer: Self-pay | Admitting: Cardiovascular Disease

## 2019-05-06 ENCOUNTER — Encounter: Payer: Self-pay | Admitting: Family Medicine

## 2019-05-06 ENCOUNTER — Ambulatory Visit (INDEPENDENT_AMBULATORY_CARE_PROVIDER_SITE_OTHER): Payer: 59 | Admitting: Family Medicine

## 2019-05-06 ENCOUNTER — Other Ambulatory Visit: Payer: Self-pay

## 2019-05-06 ENCOUNTER — Ambulatory Visit: Payer: Self-pay

## 2019-05-06 VITALS — BP 110/68 | Ht 68.0 in | Wt 177.0 lb

## 2019-05-06 DIAGNOSIS — M75102 Unspecified rotator cuff tear or rupture of left shoulder, not specified as traumatic: Secondary | ICD-10-CM

## 2019-05-06 DIAGNOSIS — G8929 Other chronic pain: Secondary | ICD-10-CM | POA: Diagnosis not present

## 2019-05-06 DIAGNOSIS — M25512 Pain in left shoulder: Secondary | ICD-10-CM

## 2019-05-06 NOTE — Patient Instructions (Signed)
Get x-rays of your shoulder at your convenience - this will assess if you have a frozen shoulder or arthritis that's causing your limited motion. You have a small rotator cuff tear with mild bursitis also. We will wait on the x-rays before deciding on specific treatment but typically the next step is formal physical therapy. Naproxen (or aleve) twice a day with food for pain and inflammation. Ok to take tylenol with this. A steroid injection may be beneficial and is an option. Nitro patches may be helpful as well if you're not improving with physical therapy. Follow up with me in 6 weeks for this issue.

## 2019-05-06 NOTE — Progress Notes (Signed)
    SUBJECTIVE:  CHIEF COMPLAINT / HPI:   Chronic Left Shoulder Pain  Patient coming in as referral from [facility] for left chronic shoulder pain.  Per chart review, patient reports years in duration with no acute worsening, but bothersome to patient.  Patient's job includes manual labor and feels that he is using his right arm to compensate for pain of left shoulder. PCP referral specifically for shoulder ultrasound.  Reviewed HPI with patient which was consistent with Dr. Chauncey Mann. Patient with lateral left shoulder pain worse with overhead motion, reaching.  No neck pain.  Separately reports left medial elbow and forearm pain.  PERTINENT  PMH / PSH: NIDDM, well controlled   OBJECTIVE:   BP 110/68   Ht 5\' 8"  (1.727 m)   Wt 177 lb (80.3 kg)   BMI 26.91 kg/m   General: well appearing male, appears older than stated age, NAD Neck: No abnormal findings on visual exam.  No tenderness to palpation of cervical spinous processes.  Full range of motion without reproduction of his shoulder pain. Shoulders: No gross abnormality bilaterally. Tenderness to palpation in the area just posterior to humeral head.  Otherwise, no localized tenderness to palpation of medial, superior, lateral scapula. Limited passive left arm external rotation compared to right side (25 degrees compared to 60 on right).  Strength 5/5 empty can, IR, ER with mild pain empty can.  Pain with left-sided liftoff test.  Positive painful arc.  Negative hawkins, neers, yergasons, apprehension.  Shoulder ultrasound: Biceps tendon intact on long and trans views without tenosynovitis Pec major tendon intact without abnormalities Subscapularis normal without tear AC joint minimal arthropathy without effusion Infraspinatus intact without tear Supraspinatus with small partial thickness bursal-sided tear on long and trans views.  Mild bursitis Posterior shoulder without obvious arthropathy.  Visible labrum  intact.  Impression:  Mild subacromial bursitis, partial thickness bursal-sided supraspinatus tear  ASSESSMENT/PLAN:   1. Frozen shoulder vs glenohumeral arthritis Given patient's diabetes, frozen shoulder high on differential versus glenohumeral arthritis, especially in the setting of limited external rotation.  Will obtain x-rays for this patient to assess for arthritis.  Treatment for now will include home exercises and physical therapy.  We will follow-up with patient with x-ray results.   2. Mild tear of supraspinatus  Discussed options for treatment with patient.  Discussed conservative measures with over-the-counter pain relievers (normal renal function) as needed and rehabilitation via physical therapy.  No plans for injections given patient's previous injection with no improvement, concurrent partial thickness tear - will try to treat conservatively.   3. Chronic Left Medial Elbow pain and Acute right forearm shooting pain and finger numbness  Patient also has additional complaints about his left elbow and pain in his right arm.  Asked patient to get referral from Dr. to return to sports medicine to address these problems if he desires.  Annia Friendly, MD [facility]

## 2019-05-07 ENCOUNTER — Encounter: Payer: Self-pay | Admitting: Family Medicine

## 2019-05-17 ENCOUNTER — Ambulatory Visit (HOSPITAL_COMMUNITY): Payer: 59 | Attending: Cardiovascular Disease

## 2019-05-17 ENCOUNTER — Other Ambulatory Visit: Payer: Self-pay

## 2019-05-17 DIAGNOSIS — R0789 Other chest pain: Secondary | ICD-10-CM | POA: Insufficient documentation

## 2019-05-22 ENCOUNTER — Encounter: Payer: Self-pay | Admitting: Cardiovascular Disease

## 2019-05-30 ENCOUNTER — Ambulatory Visit: Payer: 59 | Attending: Internal Medicine

## 2019-05-30 DIAGNOSIS — Z23 Encounter for immunization: Secondary | ICD-10-CM

## 2019-05-30 NOTE — Progress Notes (Signed)
   Covid-19 Vaccination Clinic  Name:  James Pratt    MRN: [id number] DOB: 07/26/1970  05/30/2019  Mr. Filter was observed post Covid-19 immunization for 15 minutes without incident. He was provided with Vaccine Information Sheet and instruction to access the V-Safe system.   [name] was instructed to call 911 with any severe reactions post vaccine: Marland Kitchen Difficulty breathing  . Swelling of face and throat  . A fast heartbeat  . A bad rash all over body  . Dizziness and weakness   Immunizations Administered    Name Date Dose VIS Date Route   Pfizer COVID-19 Vaccine 05/30/2019  9:08 AM 0.3 mL 02/15/2019 Intramuscular   Manufacturer: [employer], [employer]   Lot: [id number]   NDC: [address]-4

## 2019-06-03 ENCOUNTER — Telehealth: Payer: Self-pay

## 2019-06-03 NOTE — Telephone Encounter (Signed)
Called and spoke with pt and pt's wife to review Echo results from Dr.Kelly. pt's wife states that for the last four days her husband has had to take aspirin "under his tongue" for chest pain. Pt states that he is currently having chest pain. Notified that he should seek immediate evaluation at his nearest ED with active chest pain. Pt's wife states that when he does go to the ED they say there is nothing wrong and send him home. Asked when the last time he went to the ED for chest pain was and she stated that they were the ones that told him to see a cardiologist.  Notified that I would send this to [facility] for review but that we still advise that the pt seek evaluation in his nearest emergency room. Wife verbalized understanding with no other questions at this time.  Will route to MD

## 2019-06-04 NOTE — Telephone Encounter (Signed)
Patient's chest pain does not sound ischemic.  Suggested possible GI etiology

## 2019-06-05 NOTE — Telephone Encounter (Signed)
Called and spoke with pt, notified that Dr.Kelly did not believe his chest pain to be ischemic but that it was possibly GI related. Notified pt to discuss with his PCP and that they may refer him to a GI doctor. Pt verbalized understanding with no other questions at this time.

## 2019-06-17 ENCOUNTER — Ambulatory Visit: Payer: 59 | Admitting: Family Medicine

## 2019-06-24 ENCOUNTER — Ambulatory Visit: Payer: 59 | Attending: Internal Medicine

## 2019-06-24 DIAGNOSIS — Z23 Encounter for immunization: Secondary | ICD-10-CM

## 2019-06-24 NOTE — Progress Notes (Signed)
   Covid-19 Vaccination Clinic  Name:  LIO WEHRLY    MRN: [id number] DOB: 07-Oct-1970  06/24/2019  Mr. Tejada was observed post Covid-19 immunization for 15 minutes without incident. He was provided with Vaccine Information Sheet and instruction to access the V-Safe system.   Mr. Mihalik was instructed to call 911 with any severe reactions post vaccine: Marland Kitchen Difficulty breathing  . Swelling of face and throat  . A fast heartbeat  . A bad rash all over body  . Dizziness and weakness   Immunizations Administered    Name Date Dose VIS Date Route   Pfizer COVID-19 Vaccine 06/24/2019 10:51 AM 0.3 mL 05/01/2018 Intramuscular   Manufacturer: [employer], [employer]   Lot: [id number]   NDC: [address]-8

## 2019-09-23 ENCOUNTER — Other Ambulatory Visit: Payer: Self-pay | Admitting: Family Medicine

## 2020-01-01 ENCOUNTER — Other Ambulatory Visit: Payer: Self-pay | Admitting: Family Medicine

## 2020-03-12 ENCOUNTER — Other Ambulatory Visit: Payer: Self-pay | Admitting: Family Medicine

## 2020-07-15 ENCOUNTER — Ambulatory Visit (INDEPENDENT_AMBULATORY_CARE_PROVIDER_SITE_OTHER): Payer: Self-pay

## 2020-07-15 ENCOUNTER — Encounter (HOSPITAL_COMMUNITY): Payer: Self-pay | Admitting: Emergency Medicine

## 2020-07-15 ENCOUNTER — Ambulatory Visit (HOSPITAL_COMMUNITY)
Admission: EM | Admit: 2020-07-15 | Discharge: 2020-07-15 | Disposition: A | Discharge status: Home / Self Care | Payer: 59 | Attending: Physician Assistant | Admitting: Physician Assistant

## 2020-07-15 ENCOUNTER — Other Ambulatory Visit: Payer: Self-pay

## 2020-07-15 DIAGNOSIS — J329 Chronic sinusitis, unspecified: Secondary | ICD-10-CM

## 2020-07-15 DIAGNOSIS — R058 Other specified cough: Secondary | ICD-10-CM

## 2020-07-15 DIAGNOSIS — R0789 Other chest pain: Secondary | ICD-10-CM

## 2020-07-15 DIAGNOSIS — J40 Bronchitis, not specified as acute or chronic: Secondary | ICD-10-CM

## 2020-07-15 DIAGNOSIS — R0602 Shortness of breath: Secondary | ICD-10-CM

## 2020-07-15 DIAGNOSIS — R059 Cough, unspecified: Secondary | ICD-10-CM

## 2020-07-15 MED ORDER — DOXYCYCLINE HYCLATE 100 MG PO CAPS: 100.0000 mg | ORAL_CAPSULE | Freq: Two times a day (BID) | ORAL | 0 refills | Status: DC

## 2020-07-15 MED ORDER — ALBUTEROL SULFATE HFA 108 (90 BASE) MCG/ACT IN AERS: 1.0000 | INHALATION_SPRAY | Freq: Four times a day (QID) | RESPIRATORY_TRACT | 0 refills | Status: DC | PRN

## 2020-07-15 NOTE — Discharge Instructions (Signed)
Use albuterol every 4-6 hours as needed for shortness of breath.  If you are having to use it more frequently you need to go to the emergency room.  Take doxycycline twice daily for 10 days.  This can upset your stomach so take it with food.  It also can make you more sensitive to the sun so please try to stay out of the sun while on this.  Use Mucinex and Flonase for additional symptom relief.  If you have any worsening symptoms you need to go to the ER as we discussed.  Follow-up with your PCP within 1 week.

## 2020-07-15 NOTE — ED Provider Notes (Signed)
[facility]    CSN: [id number] Arrival date & time: 07/15/20  [id number]      History   Chief Complaint Chief Complaint  Patient presents with  . Cough  . Nasal Congestion    HPI James Pratt is a 50 y.o. male.   Patient presents today with a 1 week history of cough and nasal congestion.  Reports over the past 24 hours he has developed chest tightness and shortness of breath.  He reports pain is rated 5 on a 0-10 pain scale, localized to anterior chest without radiation, described as tightness, no aggravating relieving factors identified.  He does have a history of allergies but is not currently taking any antihistamines.  Denies history of smoking cigarettes, asthma, COPD.  He has not had influenza shot but has had COVID-19 vaccinations including booster.  Denies any known sick contacts.  Believes symptoms began as allergies and have progressively worsened to possible sinus infection.  He denies any recent antibiotics.  He does have a history of diabetes and hyperlipidemia.  Denies hypertension, family history of heart disease, cigarette smoking.     Past Medical History:  Diagnosis Date  . Bilateral knee pain 12/29/2017  . Blind left eye   . Cataract    forming right eye   . Contusion of toenail 05/08/2017  . Depression    situational after death of brother   . Diabetes mellitus without complication (HCC)   . GERD (gastroesophageal reflux disease)    past hx of GERD- denies currently  . Hyperlipidemia   . Orthostatic hypotension 10/03/2016    Patient Active Problem List   Diagnosis Date Noted  . Shoulder pain, left 04/27/2019  . 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 Polyneuropathy 09/30/2016  . Hypercholesteremia 06/25/2014  . Arthritis 06/25/2014  . Onychomycosis 06/25/2014  . TOBACCO USER 01/17/2009  . Diabetes mellitus type II, uncontrolled (HCC) 12/18/2008  . ALCOHOL ABUSE 12/18/2008  . CANNABIS ABUSE 12/18/2008  . BLINDNESS, LEFT EYE  12/18/2008  . PTERYGIUM 12/18/2008  . 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 EYE SURGERY Left        Home Medications    Prior to Admission medications   Medication Sig Start Date End Date Taking? Authorizing Provider  albuterol (VENTOLIN HFA) 108 (90 Base) MCG/ACT inhaler Inhale 1-2 puffs into the lungs every 6 (six) hours as needed for wheezing or shortness of breath. 07/15/20  Yes Daniah Zaldivar K, PA-C  doxycycline (VIBRAMYCIN) 100 MG capsule Take 1 capsule (100 mg total) by mouth 2 (two) times daily. 07/15/20  Yes Fiora Weill, Noberto RetortErin K, PA-C  acetaminophen (TYLENOL) 500 MG tablet Take 1,000 mg by mouth every 6 (six) hours as needed for moderate pain.    [provider]  dorzolamide-timolol (COSOPT) 22.3-6.8 MG/ML ophthalmic solution Place 1 drop into the left eye daily. 10/13/11   [provider]  fluticasone (FLONASE) 50 MCG/ACT nasal spray Place 2 sprays into both nostrils daily. 04/14/17   Cathie HoopsYu, Amy V, PA-C  glipiZIDE (GLUCOTROL) 5 MG tablet Take 0.5 tablets (2.5 mg total) by mouth 2 (two) times daily before a meal. 04/26/19   Leticia PennaBeard, Samantha N, DO  glucose blood (ONE TOUCH ULTRA TEST) test strip Dispense QS for twice daily testing 02/22/19   Allayne StackBeard, Samantha N, DO  ibuprofen (ADVIL,MOTRIN) 200 MG tablet Take 800 mg by mouth every 6 (six) hours as needed for moderate pain.    [provider]  ipratropium (  ATROVENT) 0.06 % nasal spray Place 2 sprays into both nostrils 4 (four) times daily. 04/14/17   Cathie Hoops, Amy V, PA-C  metFORMIN (GLUCOPHAGE) 500 MG tablet TAKE TWO TABLETS TWICE DAILY WITH MEALS 03/12/20   [name], DO  rosuvastatin (CRESTOR) 5 MG tablet Take 1 tablet (5 mg total) by mouth at bedtime. 03/19/19   [name], DO  sildenafil (VIAGRA) 25 MG tablet Take 1 tablet (25 mg total) by mouth daily as needed for erectile dysfunction. Take 1 hour prior to sexual activity. 03/19/19   [name], DO    Family  History Family History  Problem Relation Age of Onset  . Colon cancer Neg Hx   . Colon polyps Neg Hx   . Esophageal cancer Neg Hx   . Rectal cancer Neg Hx   . Stomach cancer Neg Hx     Social History Social History   Tobacco Use  . Smoking status: Former Smoker    Packs/day: 0.25    Types: Cigars    Quit date: 10/2017    Years since quitting: 2.7  . Smokeless tobacco: Never Used  Substance Use Topics  . Alcohol use: No    Alcohol/week: 0.0 standard drinks  . Drug use: No     Allergies   Meloxicam   Review of Systems Review of Systems  Constitutional: Positive for activity change. Negative for appetite change, fatigue and fever.  HENT: Positive for congestion, postnasal drip and sinus pressure. Negative for sneezing and sore throat.   Respiratory: Positive for cough and shortness of breath.   Cardiovascular: Positive for chest pain. Negative for palpitations.  Gastrointestinal: Negative for abdominal pain, diarrhea, nausea and vomiting.  Musculoskeletal: Negative for arthralgias and myalgias.  Neurological: Positive for headaches. Negative for dizziness and light-headedness.     Physical Exam Triage Vital Signs ED Triage Vitals  Enc Vitals Group     BP 07/15/20 1010 116/70     Pulse Rate 07/15/20 1010 64     Resp 07/15/20 1010 17     Temp 07/15/20 1010 98.3 F (36.8 C)     Temp Source 07/15/20 1010 Oral     SpO2 07/15/20 1010 100 %     Weight --      Height --      Head Circumference --      Peak Flow --      Pain Score 07/15/20 1008 3     Pain Loc --      Pain Edu? --      Excl. in GC? --    No data found.  Updated Vital Signs BP 116/70 (BP Location: Right Arm)   Pulse 64   Temp 98.3 F (36.8 C) (Oral)   Resp 17   SpO2 100%   Visual Acuity Right Eye Distance:   Left Eye Distance:   Bilateral Distance:    Right Eye Near:   Left Eye Near:    Bilateral Near:     Physical Exam Vitals reviewed.  Constitutional:      General: He is awake.      Appearance: Normal appearance. He is normal weight. He is not ill-appearing.     Comments: Very pleasant male appears stated age in no acute distress  HENT:     Head: Normocephalic and atraumatic.     Right Ear: Tympanic membrane, ear canal and external ear normal. Tympanic membrane is not erythematous or bulging.     Left Ear: Tympanic membrane, ear canal and  external ear normal. Tympanic membrane is not erythematous or bulging.     Nose: Nose normal.     Mouth/Throat:     Pharynx: Uvula midline. No oropharyngeal exudate or posterior oropharyngeal erythema.     Comments: Drainage present posterior oropharynx Cardiovascular:     Rate and Rhythm: Normal rate and regular rhythm.     Heart sounds: No murmur heard.   Pulmonary:     Effort: Pulmonary effort is normal. No accessory muscle usage or respiratory distress.     Breath sounds: Normal breath sounds. No stridor. No wheezing, rhonchi or rales.     Comments: Clear to auscultation bilaterally Chest:     Chest wall: No deformity, swelling or tenderness.  Abdominal:     General: Bowel sounds are normal.     Palpations: Abdomen is soft.     Tenderness: There is no abdominal tenderness.  Lymphadenopathy:     Head:     Right side of head: No submental, submandibular or tonsillar adenopathy.     Left side of head: No submental, submandibular or tonsillar adenopathy.     Cervical: No cervical adenopathy.  Neurological:     Mental Status: He is alert.  Psychiatric:        Behavior: Behavior is cooperative.      UC Treatments / Results  Labs (all labs ordered are listed, but only abnormal results are displayed) Labs Reviewed - No data to display  EKG   Radiology DG Chest 2 View  Result Date: 07/15/2020 CLINICAL DATA:  Productive cough, chest tightness EXAM: CHEST - 2 VIEW COMPARISON:  02/23/2019 FINDINGS: The heart size and mediastinal contours are within normal limits. Both lungs are clear. The visualized skeletal  structures are unremarkable. IMPRESSION: No active cardiopulmonary disease. Electronically Signed   By: Charlett Nose M.D.   On: 07/15/2020 11:44    Procedures Procedures (including critical care time)  Medications Ordered in UC Medications - No data to display  Initial Impression / Assessment and Plan / UC Course  I have reviewed the triage vital signs and the nursing notes.  Pertinent labs & imaging results that were available during my care of the patient were reviewed by me and considered in my medical decision making (see chart for details).     EKG shows normal sinus rhythm with ventricular rate of 60 bpm without significant change from 05/03/2019 tracing in no acute changes.  Chest x-ray was normal.  Suspect sinobronchitis as etiology of symptoms.  Patient was prescribed albuterol with proper instruction on how to use this medication during visit today.  He was prescribed doxycycline twice daily for 10 days with instruction to avoid prolonged sun exposure due to photosensitivity associated with medication.  Recommended he use Mucinex and Flonase for additional symptom relief.  Recommended rest and drink plenty of fluid until symptoms resolve.  Discussed alarm symptoms that would warrant emergent evaluation.  Strict return precautions given to which patient expressed understanding.  Final Clinical Impressions(s) / UC Diagnoses   Final diagnoses:  Sinobronchitis  Productive cough  Chest tightness  Shortness of breath     Discharge Instructions     Use albuterol every 4-6 hours as needed for shortness of breath.  If you are having to use it more frequently you need to go to the emergency room.  Take doxycycline twice daily for 10 days.  This can upset your stomach so take it with food.  It also can make you more sensitive to the sun so  please try to stay out of the sun while on this.  Use Mucinex and Flonase for additional symptom relief.  If you have any worsening symptoms you need  to go to the ER as we discussed.  Follow-up with your PCP within 1 week.    ED Prescriptions    Medication Sig Dispense Auth. Provider   albuterol (VENTOLIN HFA) 108 (90 Base) MCG/ACT inhaler Inhale 1-2 puffs into the lungs every 6 (six) hours as needed for wheezing or shortness of breath. 8 g Jaysten Essner K, PA-C   doxycycline (VIBRAMYCIN) 100 MG capsule Take 1 capsule (100 mg total) by mouth 2 (two) times daily. 20 capsule Dayvian Blixt, Noberto Retort, PA-C     PDMP not reviewed this encounter.   Jeani Hawking, PA-C 07/15/20 1207

## 2020-07-15 NOTE — ED Triage Notes (Signed)
Pt presents with cough, headache and congestion xs 1 week. States mucinex gave some relief.

## 2020-08-11 ENCOUNTER — Ambulatory Visit (INDEPENDENT_AMBULATORY_CARE_PROVIDER_SITE_OTHER): Payer: 59

## 2020-08-11 ENCOUNTER — Emergency Department (HOSPITAL_COMMUNITY)
Admission: EM | Admit: 2020-08-11 | Discharge: 2020-08-11 | Discharge status: Against Medical Advice | Payer: 59 | Attending: Emergency Medicine | Admitting: Emergency Medicine

## 2020-08-11 ENCOUNTER — Encounter (HOSPITAL_COMMUNITY): Payer: Self-pay

## 2020-08-11 ENCOUNTER — Other Ambulatory Visit: Payer: Self-pay

## 2020-08-11 ENCOUNTER — Ambulatory Visit (HOSPITAL_COMMUNITY)
Admission: EM | Admit: 2020-08-11 | Discharge: 2020-08-11 | Disposition: A | Discharge status: Home / Self Care | Payer: 59 | Attending: Internal Medicine | Admitting: Internal Medicine

## 2020-08-11 DIAGNOSIS — R079 Chest pain, unspecified: Secondary | ICD-10-CM | POA: Diagnosis not present

## 2020-08-11 DIAGNOSIS — R0789 Other chest pain: Secondary | ICD-10-CM

## 2020-08-11 DIAGNOSIS — E1165 Type 2 diabetes mellitus with hyperglycemia: Secondary | ICD-10-CM

## 2020-08-11 DIAGNOSIS — Z5321 Procedure and treatment not carried out due to patient leaving prior to being seen by health care provider: Secondary | ICD-10-CM | POA: Diagnosis not present

## 2020-08-11 LAB — CBC
HCT: 43.5 % (ref 39.0–52.0)
Hemoglobin: 15.1 g/dL (ref 13.0–17.0)
MCH: 30.1 pg (ref 26.0–34.0)
MCHC: 34.7 g/dL (ref 30.0–36.0)
MCV: 86.8 fL (ref 80.0–100.0)
Platelets: 161 10*3/uL (ref 150–400)
RBC: 5.01 MIL/uL (ref 4.22–5.81)
RDW: 12.5 % (ref 11.5–15.5)
WBC: 6.9 10*3/uL (ref 4.0–10.5)
nRBC: 0 % (ref 0.0–0.2)

## 2020-08-11 LAB — BASIC METABOLIC PANEL
Anion gap: 7 (ref 5–15)
BUN: 10 mg/dL (ref 6–20)
CO2: 29 mmol/L (ref 22–32)
Calcium: 9.7 mg/dL (ref 8.9–10.3)
Chloride: 101 mmol/L (ref 98–111)
Creatinine, Ser: 0.84 mg/dL (ref 0.61–1.24)
GFR, Estimated: >60 mL/min (ref >60)
Glucose, Bld: 282 mg/dL — ABNORMAL HIGH (ref 70–99)
Potassium: 4.6 mmol/L (ref 3.5–5.1)
Sodium: 137 mmol/L (ref 135–145)

## 2020-08-11 LAB — CBG MONITORING, ED: Glucose-Capillary: 345 mg/dL — ABNORMAL HIGH (ref 70–99)

## 2020-08-11 LAB — TROPONIN I (HIGH SENSITIVITY): Troponin I (High Sensitivity): 3 ng/L (ref <18)

## 2020-08-11 NOTE — ED Triage Notes (Signed)
Pt sent from UC for further eval of two weeks of constant L sided chest pain. Denies radiation, n/v, shob.

## 2020-08-11 NOTE — ED Notes (Signed)
Called for repeat VS x3, no response. 

## 2020-08-11 NOTE — ED Provider Notes (Signed)
[facility]   MRN: [id number] DOB: 12-Sep-1970  Subjective:   James Pratt is a 50 y.o. male presenting for 2-week history of persistent intermittent and worsening left-sided chest pain.  Patient was seen earlier in May with similar symptoms and was prescribed doxycycline.  That episode resolved but in the past couple of weeks his symptoms have returned and today's chest pain is moderate to severe, mostly constant.  He is a diabetic, is not taking any of his medications.  Has not had follow-up for this.  Denies fever, shortness of breath, abdominal pain, neck pain, left arm pain, diaphoresis, trauma, fall.  Patient is a smoker, uses marijuana regularly.  No current facility-administered medications for this encounter.  Current Outpatient Medications:  .  acetaminophen (TYLENOL) 500 MG tablet, Take 1,000 mg by mouth every 6 (six) hours as needed for moderate pain., Disp: , Rfl:  .  albuterol (VENTOLIN HFA) 108 (90 Base) MCG/ACT inhaler, Inhale 1-2 puffs into the lungs every 6 (six) hours as needed for wheezing or shortness of breath., Disp: 8 g, Rfl: 0 .  doxycycline (VIBRAMYCIN) 100 MG capsule, Take 1 capsule (100 mg total) by mouth 2 (two) times daily., Disp: 20 capsule, Rfl: 0 .  fluticasone (FLONASE) 50 MCG/ACT nasal spray, Place 2 sprays into both nostrils daily., Disp: 1 g, Rfl: 0 .  glipiZIDE (GLUCOTROL) 5 MG tablet, Take 0.5 tablets (2.5 mg total) by mouth 2 (two) times daily before a meal., Disp: 30 tablet, Rfl: 1 .  ibuprofen (ADVIL,MOTRIN) 200 MG tablet, Take 800 mg by mouth every 6 (six) hours as needed for moderate pain., Disp: , Rfl:  .  ipratropium (ATROVENT) 0.06 % nasal spray, Place 2 sprays into both nostrils 4 (four) times daily., Disp: 15 mL, Rfl: 0 .  metFORMIN (GLUCOPHAGE) 500 MG tablet, TAKE TWO TABLETS TWICE DAILY WITH MEALS, Disp: 120 tablet, Rfl: 1 .  sildenafil (VIAGRA) 25 MG tablet, Take 1 tablet (25 mg total) by mouth daily as needed for  erectile dysfunction. Take 1 hour prior to sexual activity., Disp: 10 tablet, Rfl: 0 .  dorzolamide-timolol (COSOPT) 22.3-6.8 MG/ML ophthalmic solution, Place 1 drop into the left eye daily., Disp: , Rfl:  .  glucose blood (ONE TOUCH ULTRA TEST) test strip, Dispense QS for twice daily testing, Disp: 100 each, Rfl: 12 .  rosuvastatin (CRESTOR) 5 MG tablet, Take 1 tablet (5 mg total) by mouth at bedtime., Disp: 60 tablet, Rfl: 1   Allergies  Allergen Reactions  . Meloxicam Tinitus    Past Medical History:  Diagnosis Date  . Bilateral knee pain 12/29/2017  . Blind left eye   . Cataract    forming right eye   . Contusion of toenail 05/08/2017  . Depression    situational after death of brother   . Diabetes mellitus without complication (HCC)   . GERD (gastroesophageal reflux disease)    past hx of GERD- denies currently  . Hyperlipidemia   . Orthostatic hypotension 10/03/2016     Past Surgical History:  Procedure Laterality Date  . EYE SURGERY Left     Family History  Problem Relation Age of Onset  . Colon cancer Neg Hx   . Colon polyps Neg Hx   . Esophageal cancer Neg Hx   . Rectal cancer Neg Hx   . Stomach cancer Neg Hx     Social History   Tobacco Use  . Smoking status: Current Some Day Smoker    Packs/day: 0.25  Types: Cigars    Last attempt to quit: 10/2017    Years since quitting: 2.8  . Smokeless tobacco: Never Used  . Tobacco comment: cigars every now and then  Substance Use Topics  . Alcohol use: No    Alcohol/week: 0.0 standard drinks  . Drug use: Yes    Types: Marijuana    Comment: daily    ROS   Objective:   Vitals: BP 118/78   Pulse 73   Temp 98.7 F (37.1 C)   Resp 18   SpO2 98%   Physical Exam Constitutional:      General: He is not in acute distress.    Appearance: Normal appearance. He is well-developed. He is not ill-appearing, toxic-appearing or diaphoretic.  HENT:     Head: Normocephalic and atraumatic.     Right Ear:  External ear normal.     Left Ear: External ear normal.     Nose: Nose normal.     Mouth/Throat:     Mouth: Mucous membranes are moist.     Pharynx: Oropharynx is clear.  Eyes:     General: No scleral icterus.    Extraocular Movements: Extraocular movements intact.     Pupils: Pupils are equal, round, and reactive to light.  Cardiovascular:     Rate and Rhythm: Normal rate and regular rhythm.     Heart sounds: Normal heart sounds. No murmur heard. No friction rub. No gallop.   Pulmonary:     Effort: Pulmonary effort is normal. No respiratory distress.     Breath sounds: Normal breath sounds. No stridor. No wheezing, rhonchi or rales.  Chest:     Chest wall: Tenderness (slightly reproducible) present.  Neurological:     Mental Status: He is alert and oriented to person, place, and time.  Psychiatric:        Mood and Affect: Mood normal.        Behavior: Behavior normal.        Thought Content: Thought content normal.     Results for orders placed or performed during the hospital encounter of 08/11/20 (from the past 24 hour(s))  POC CBG monitoring     Status: Abnormal   Collection Time: 08/11/20 11:10 AM  Result Value Ref Range   Glucose-Capillary 345 (H) 70 - 99 mg/dL   DG Chest 2 View  Result Date: 08/11/2020 CLINICAL DATA:  Chest pain. EXAM: CHEST - 2 VIEW COMPARISON:  Jul 15, 2020. FINDINGS: The heart size and mediastinal contours are within normal limits. Both lungs are clear. The visualized skeletal structures are unremarkable. IMPRESSION: No active cardiopulmonary disease. Electronically Signed   By: Lupita Raider M.D.   On: 08/11/2020 11:49   ED ECG REPORT   Date: 08/11/2020  Rate: 79bpm  Rhythm: normal sinus rhythm  QRS Axis: normal  Intervals: normal  ST/T Wave abnormalities: nonspecific T wave changes  Conduction Disutrbances:none  Narrative Interpretation: Sinus rhythm at 79 bpm with nonspecific T wave flattening in lead aVL.  Very comparable to previous  EKG.  Old EKG Reviewed: unchanged  I have personally reviewed the EKG tracing and agree with the computerized printout as noted.  Assessment and Plan :   PDMP not reviewed this encounter.  1. Atypical chest pain   2. Left-sided chest pain   3. Uncontrolled type 2 diabetes mellitus with hyperglycemia (HCC)     Patient has significant cardiac risk factors including uncontrolled diabetes, being a 49 year old [place] male.  He is already undergone a course  of antibiotics and his chest x-ray is negative today.  As such, recommended an evaluation for rule out of a heart event in the emergency room now.  Vital signs are stable and therefore patient was agreeable to go to the ER by personal vehicle as opposed to EMS.    Wallis Bamberg, PA-C 08/11/20 1223

## 2020-08-11 NOTE — Discharge Instructions (Addendum)
Your chest x-ray was negative and you have already taken a course of antibiotics to cover for infection. You have risk factors for your heart including your age, smoking, diabetes. Please report to the emergency room now for further evaluation of your moderate to severe chest pain. You will need more ekg studies, lab work to make sure you are not having a heart event like a heart attack. Please go straight to the ER.

## 2020-08-11 NOTE — ED Triage Notes (Signed)
Pt c/o left side chest pain for couple of weeks, worsening this morning. States pain was previously intermittent but today is constant. States that pain sometimes radiates to center of chest.

## 2020-08-11 NOTE — ED Notes (Signed)
No answer for VS, pt not seen in the waiting area.

## 2020-08-11 NOTE — ED Notes (Signed)
Called pt 3x with no answer. 

## 2020-08-13 ENCOUNTER — Emergency Department (HOSPITAL_BASED_OUTPATIENT_CLINIC_OR_DEPARTMENT_OTHER)
Admission: EM | Admit: 2020-08-13 | Discharge: 2020-08-13 | Disposition: A | Discharge status: Home / Self Care | Payer: 59 | Attending: Emergency Medicine | Admitting: Emergency Medicine

## 2020-08-13 ENCOUNTER — Encounter (HOSPITAL_BASED_OUTPATIENT_CLINIC_OR_DEPARTMENT_OTHER): Payer: Self-pay | Admitting: Obstetrics and Gynecology

## 2020-08-13 ENCOUNTER — Other Ambulatory Visit: Payer: Self-pay

## 2020-08-13 DIAGNOSIS — R0789 Other chest pain: Secondary | ICD-10-CM | POA: Diagnosis not present

## 2020-08-13 DIAGNOSIS — R079 Chest pain, unspecified: Secondary | ICD-10-CM | POA: Diagnosis present

## 2020-08-13 DIAGNOSIS — F17210 Nicotine dependence, cigarettes, uncomplicated: Secondary | ICD-10-CM | POA: Insufficient documentation

## 2020-08-13 DIAGNOSIS — R739 Hyperglycemia, unspecified: Secondary | ICD-10-CM

## 2020-08-13 DIAGNOSIS — Z7984 Long term (current) use of oral hypoglycemic drugs: Secondary | ICD-10-CM | POA: Diagnosis not present

## 2020-08-13 DIAGNOSIS — E1165 Type 2 diabetes mellitus with hyperglycemia: Secondary | ICD-10-CM | POA: Insufficient documentation

## 2020-08-13 LAB — CBC
HCT: 40.6 % (ref 39.0–52.0)
Hemoglobin: 14.5 g/dL (ref 13.0–17.0)
MCH: 30 pg (ref 26.0–34.0)
MCHC: 35.7 g/dL (ref 30.0–36.0)
MCV: 83.9 fL (ref 80.0–100.0)
Platelets: 155 10*3/uL (ref 150–400)
RBC: 4.84 MIL/uL (ref 4.22–5.81)
RDW: 12.2 % (ref 11.5–15.5)
WBC: 7.2 10*3/uL (ref 4.0–10.5)
nRBC: 0 % (ref 0.0–0.2)

## 2020-08-13 LAB — BASIC METABOLIC PANEL
Anion gap: 10 (ref 5–15)
BUN: 17 mg/dL (ref 6–20)
CO2: 24 mmol/L (ref 22–32)
Calcium: 8.9 mg/dL (ref 8.9–10.3)
Chloride: 98 mmol/L (ref 98–111)
Creatinine, Ser: 1 mg/dL (ref 0.61–1.24)
GFR, Estimated: >60 mL/min (ref >60)
Glucose, Bld: 413 mg/dL — ABNORMAL HIGH (ref 70–99)
Potassium: 3.9 mmol/L (ref 3.5–5.1)
Sodium: 132 mmol/L — ABNORMAL LOW (ref 135–145)

## 2020-08-13 LAB — CBG MONITORING, ED: Glucose-Capillary: 256 mg/dL — ABNORMAL HIGH (ref 70–99)

## 2020-08-13 LAB — TROPONIN I (HIGH SENSITIVITY)
Troponin I (High Sensitivity): 2 ng/L (ref <18)
Troponin I (High Sensitivity): 2 ng/L (ref <18)

## 2020-08-13 MED ORDER — SODIUM CHLORIDE 0.9 % IV BOLUS
1000.0000 mL | Freq: Once | INTRAVENOUS | Status: AC
  Administered 2020-08-13: 1000 mL via INTRAVENOUS

## 2020-08-13 NOTE — ED Provider Notes (Signed)
MEDCENTER [facility] EMERGENCY DEPARTMENT Provider Note  CSN: [id number] Arrival date & time: 08/13/20 1148    History Chief Complaint  Patient presents with   Chest Pain     Chest Pain  James Pratt is a 50 y.o. male with history of poorly controlled DM (non compliant with meds, diet or home glucose monitoring) here for 2 weeks of midsternal and left sided chest pain, comes and goes. Not associated with SOB. No particular provoking or relieving factors. Does not radiate. He was seen at [facility] for same 2 days ago referred to the ED. He had labs drawn and EKG done but left before seeing a provider. Symptoms have continued today. He smokes tobacco and marijuana. He has been seen by PCP for CP in the past, referred to Cardiology who did not feel it was cardiac in etiology, more likely GI.    Past Medical History:  Diagnosis Date   Bilateral knee pain 12/29/2017   Blind left eye    Cataract    forming right eye    Contusion of toenail 05/08/2017   Depression    situational after death of brother    Diabetes mellitus without complication (HCC)    GERD (gastroesophageal reflux disease)    past hx of GERD- denies currently   Hyperlipidemia    Orthostatic hypotension 10/03/2016    Past Surgical History:  Procedure Laterality Date   EYE SURGERY Left     Family History  Problem Relation Age of Onset   Colon cancer Neg Hx    Colon polyps Neg Hx    Esophageal cancer Neg Hx    Rectal cancer Neg Hx    Stomach cancer Neg Hx     Social History   Tobacco Use   Smoking status: Some Days    Packs/day: 0.25    Pack years: 0.00    Types: Cigars, Cigarettes    Last attempt to quit: 10/2017    Years since quitting: 2.8   Smokeless tobacco: Never   Tobacco comments:    cigars every now and then  Vaping Use   Vaping Use: Never used  Substance Use Topics   Alcohol use: No    Alcohol/week: 0.0 standard drinks   Drug use: Not Currently    Types: Marijuana    Comment:  daily     Home Medications Prior to Admission medications   Medication Sig Start Date End Date Taking? Authorizing Provider  acetaminophen (TYLENOL) 500 MG tablet Take 1,000 mg by mouth every 6 (six) hours as needed for moderate pain.    [provider]  albuterol (VENTOLIN HFA) 108 (90 Base) MCG/ACT inhaler Inhale 1-2 puffs into the lungs every 6 (six) hours as needed for wheezing or shortness of breath. 07/15/20   Raspet, Denny Peon K, PA-C  dorzolamide-timolol (COSOPT) 22.3-6.8 MG/ML ophthalmic solution Place 1 drop into the left eye daily. 10/13/11   [provider]  doxycycline (VIBRAMYCIN) 100 MG capsule Take 1 capsule (100 mg total) by mouth 2 (two) times daily. 07/15/20   Raspet, Denny Peon K, PA-C  fluticasone (FLONASE) 50 MCG/ACT nasal spray Place 2 sprays into both nostrils daily. 04/14/17   Cathie Hoops, Amy V, PA-C  glipiZIDE (GLUCOTROL) 5 MG tablet Take 0.5 tablets (2.5 mg total) by mouth 2 (two) times daily before a meal. 04/26/19   [name], DO  glucose blood (ONE TOUCH ULTRA TEST) test strip Dispense QS for twice daily testing 02/22/19   [name], DO  ibuprofen (ADVIL,MOTRIN) 200  MG tablet Take 800 mg by mouth every 6 (six) hours as needed for moderate pain.    [provider]  ipratropium (ATROVENT) 0.06 % nasal spray Place 2 sprays into both nostrils 4 (four) times daily. 04/14/17   Cathie Hoops, Amy V, PA-C  metFORMIN (GLUCOPHAGE) 500 MG tablet TAKE TWO TABLETS TWICE DAILY WITH MEALS 03/12/20   [name], DO  rosuvastatin (CRESTOR) 5 MG tablet Take 1 tablet (5 mg total) by mouth at bedtime. 03/19/19   [name], DO  sildenafil (VIAGRA) 25 MG tablet Take 1 tablet (25 mg total) by mouth daily as needed for erectile dysfunction. Take 1 hour prior to sexual activity. 03/19/19   [name], DO     Allergies    Meloxicam   Review of Systems   Review of Systems  Cardiovascular:  Positive for chest pain.  A comprehensive review of systems was completed  and negative except as noted in HPI.    Physical Exam BP (!) 140/92   Pulse 69   Temp 98.5 F (36.9 C) (Oral)   Resp 15   Ht 5\' 7"  (1.702 m)   Wt 72.6 kg   SpO2 100%   BMI 25.06 kg/m   Physical Exam Vitals and nursing note reviewed.  Constitutional:      Appearance: Normal appearance.  HENT:     Head: Normocephalic and atraumatic.     Nose: Nose normal.     Mouth/Throat:     Mouth: Mucous membranes are moist.  Eyes:     Extraocular Movements: Extraocular movements intact.     Conjunctiva/sclera: Conjunctivae normal.  Cardiovascular:     Rate and Rhythm: Normal rate.  Pulmonary:     Effort: Pulmonary effort is normal.     Breath sounds: Normal breath sounds.  Chest:     Chest wall: Tenderness (left parasternal) present.  Abdominal:     General: Abdomen is flat.     Palpations: Abdomen is soft.     Tenderness: There is no abdominal tenderness.  Musculoskeletal:        General: No swelling. Normal range of motion.     Cervical back: Neck supple.  Skin:    General: Skin is warm and dry.  Neurological:     General: No focal deficit present.     Mental Status: He is alert.  Psychiatric:        Mood and Affect: Mood normal.     ED Results / Procedures / Treatments   Labs (all labs ordered are listed, but only abnormal results are displayed) Labs Reviewed  BASIC METABOLIC PANEL - Abnormal; Notable for the following components:      Result Value   Sodium 132 (*)    Glucose, Bld 413 (*)    All other components within normal limits  CBG MONITORING, ED - Abnormal; Notable for the following components:   Glucose-Capillary 256 (*)    All other components within normal limits  CBC  CBG MONITORING, ED  TROPONIN I (HIGH SENSITIVITY)  TROPONIN I (HIGH SENSITIVITY)    EKG EKG Interpretation  Date/Time:  Thursday August 13 2020 11:53:39 EDT Ventricular Rate:  91 PR Interval:  157 QRS Duration: 90 QT Interval:  359 QTC Calculation: 442 R Axis:   89 Text  Interpretation: Sinus rhythm Normal ECG No significant change since last tracing Confirmed by [phone]) on 08/13/2020 12:34:22 PM   Radiology No results found.  Procedures Procedures  Medications Ordered in the ED Medications  sodium chloride 0.9 % bolus 1,000 mL (0 mLs Intravenous Stopped 08/13/20 1411)     MDM Rules/Calculators/A&P MDM  Patient's labs from 2 days ago reviewed, his glucose was elevated but otherwise normal including Troponin. CXR was clear that day as well.   ED Course  I have reviewed the triage vital signs and the nursing notes.  Pertinent labs & imaging results that were available during my care of the patient were reviewed by me and considered in my medical decision making (see chart for details).  Clinical Course as of 08/13/20 1447  Thu Aug 13, 2020  1234 CBC is normal.  [CS]  1249 CBC shows elevated glucose, no signs of DKA and normal Trop. Will give a liter of saline and recheck.  [CS]  1441 CBG is improved. Trop is neg x 2. No concern for ACS given duration of his symptoms and multiple negative Trops over the last 2 days. Glucose improving, stressed importance of adhering to diet and medications. Recommend close PCP follow up.  [CS]    Clinical Course User Index [CS] Pollyann Savoy, MD    Final Clinical Impression(s) / ED Diagnoses Final diagnoses:  Chest wall pain  Hyperglycemia    Rx / DC Orders ED Discharge Orders     None        Pollyann Savoy, MD 08/13/20 1447

## 2020-08-13 NOTE — ED Triage Notes (Signed)
Patient reports to the ER for left sided chest pain. Patient reports it has been going on x2 weeks. Patient reports pain has continued.

## 2020-08-13 NOTE — ED Notes (Signed)
Patient verbalizes understanding of discharge instructions. Opportunity for questioning and answers were provided. Armband removed by staff, pt discharged from ED.  

## 2020-08-17 ENCOUNTER — Other Ambulatory Visit: Payer: Self-pay | Admitting: Family Medicine

## 2020-10-14 ENCOUNTER — Emergency Department (HOSPITAL_BASED_OUTPATIENT_CLINIC_OR_DEPARTMENT_OTHER): Payer: 59 | Admitting: Radiology

## 2020-10-14 ENCOUNTER — Emergency Department (HOSPITAL_BASED_OUTPATIENT_CLINIC_OR_DEPARTMENT_OTHER)
Admission: EM | Admit: 2020-10-14 | Discharge: 2020-10-14 | Disposition: A | Discharge status: Home / Self Care | Payer: 59 | Attending: Emergency Medicine | Admitting: Emergency Medicine

## 2020-10-14 ENCOUNTER — Other Ambulatory Visit: Payer: Self-pay

## 2020-10-14 ENCOUNTER — Encounter (HOSPITAL_BASED_OUTPATIENT_CLINIC_OR_DEPARTMENT_OTHER): Payer: Self-pay | Admitting: *Deleted

## 2020-10-14 DIAGNOSIS — R110 Nausea: Secondary | ICD-10-CM | POA: Insufficient documentation

## 2020-10-14 DIAGNOSIS — Z87891 Personal history of nicotine dependence: Secondary | ICD-10-CM | POA: Insufficient documentation

## 2020-10-14 DIAGNOSIS — R079 Chest pain, unspecified: Secondary | ICD-10-CM

## 2020-10-14 DIAGNOSIS — R531 Weakness: Secondary | ICD-10-CM | POA: Insufficient documentation

## 2020-10-14 DIAGNOSIS — R42 Dizziness and giddiness: Secondary | ICD-10-CM | POA: Insufficient documentation

## 2020-10-14 DIAGNOSIS — R519 Headache, unspecified: Secondary | ICD-10-CM | POA: Insufficient documentation

## 2020-10-14 DIAGNOSIS — E119 Type 2 diabetes mellitus without complications: Secondary | ICD-10-CM | POA: Insufficient documentation

## 2020-10-14 DIAGNOSIS — Z7984 Long term (current) use of oral hypoglycemic drugs: Secondary | ICD-10-CM | POA: Insufficient documentation

## 2020-10-14 LAB — CBC WITH DIFFERENTIAL/PLATELET
Abs Immature Granulocytes: 0.03 10*3/uL (ref 0.00–0.07)
Basophils Absolute: 0.1 10*3/uL (ref 0.0–0.1)
Basophils Relative: 1 %
Eosinophils Absolute: 0.2 10*3/uL (ref 0.0–0.5)
Eosinophils Relative: 3 %
HCT: 40.7 % (ref 39.0–52.0)
Hemoglobin: 14.3 g/dL (ref 13.0–17.0)
Immature Granulocytes: 1 %
Lymphocytes Relative: 28 %
Lymphs Abs: 1.8 10*3/uL (ref 0.7–4.0)
MCH: 29.9 pg (ref 26.0–34.0)
MCHC: 35.1 g/dL (ref 30.0–36.0)
MCV: 85.1 fL (ref 80.0–100.0)
Monocytes Absolute: 0.5 10*3/uL (ref 0.1–1.0)
Monocytes Relative: 7 %
Neutro Abs: 3.9 10*3/uL (ref 1.7–7.7)
Neutrophils Relative %: 60 %
Platelets: 161 10*3/uL (ref 150–400)
RBC: 4.78 MIL/uL (ref 4.22–5.81)
RDW: 11.9 % (ref 11.5–15.5)
WBC: 6.4 10*3/uL (ref 4.0–10.5)
nRBC: 0 % (ref 0.0–0.2)

## 2020-10-14 LAB — BASIC METABOLIC PANEL
Anion gap: 8 (ref 5–15)
BUN: 14 mg/dL (ref 6–20)
CO2: 27 mmol/L (ref 22–32)
Calcium: 9 mg/dL (ref 8.9–10.3)
Chloride: 100 mmol/L (ref 98–111)
Creatinine, Ser: 0.9 mg/dL (ref 0.61–1.24)
GFR, Estimated: >60 mL/min (ref >60)
Glucose, Bld: 217 mg/dL — ABNORMAL HIGH (ref 70–99)
Potassium: 4.4 mmol/L (ref 3.5–5.1)
Sodium: 135 mmol/L (ref 135–145)

## 2020-10-14 LAB — TROPONIN I (HIGH SENSITIVITY)
Troponin I (High Sensitivity): 2 ng/L (ref <18)
Troponin I (High Sensitivity): 2 ng/L (ref <18)

## 2020-10-14 MED ORDER — NITROGLYCERIN 0.4 MG SL SUBL
0.4000 mg | SUBLINGUAL_TABLET | Freq: Once | SUBLINGUAL | Status: AC
  Administered 2020-10-14: 0.4 mg via SUBLINGUAL

## 2020-10-14 MED ORDER — NITROGLYCERIN 0.4 MG SL SUBL
SUBLINGUAL_TABLET | SUBLINGUAL | Status: AC
  Filled 2020-10-14: qty 1

## 2020-10-14 MED ORDER — ASPIRIN 81 MG PO CHEW
CHEWABLE_TABLET | ORAL | Status: AC
  Filled 2020-10-14: qty 3

## 2020-10-14 MED ORDER — ASPIRIN 81 MG PO CHEW
243.0000 mg | CHEWABLE_TABLET | Freq: Once | ORAL | Status: AC
  Administered 2020-10-14: 243 mg via ORAL

## 2020-10-14 NOTE — ED Provider Notes (Signed)
[facility] EMERGENCY DEPT Provider Note   CSN: [id number] Arrival date & time: 10/14/20  4098     History Chief Complaint  Patient presents with   Chest Pain    James Pratt is a 50 y.o. male with PMHx of T2DM, left eye blindness, GERD, who presents to [facility] ED for chest pain. 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 He states the chest pain is middle of his chest, constant, feels like pressure on his chest, non radiating. He stopped working, took an aspirin and drank water and his nausea, headache and dizziness have improved, though he was still having chest pain which prompted him to come to the ED. He states that his chest pain has improved some while resting on the stretcher though still having some persistent pain. He has GERD but states this feels different than when he has heart burn. He has presented for chest pain in the past, most recently May/June though patient is unclear what the results of the workup showed. He denies vomiting, fevers, SOB, cough, rhinorrhea, congestion, constipation, diarrhea, focal weakness, changes in vision.   The history is provided by the patient. No language interpreter was used.  Chest Pain Associated symptoms: dizziness, headache, nausea and weakness (generalized weakness)   Associated symptoms: no abdominal pain, no cough, no fever, no numbness, no shortness of breath and no vomiting       Past Medical History:  Diagnosis Date   Bilateral knee pain 12/29/2017   Blind left eye    Cataract    forming right eye    Contusion of toenail 05/08/2017   Depression    situational after death of brother    Diabetes mellitus without complication (HCC)    GERD (gastroesophageal reflux disease)    past hx of GERD- denies currently   Hyperlipidemia    Orthostatic hypotension 10/03/2016    Patient Active Problem List    Diagnosis Date Noted   Shoulder pain, left 04/27/2019   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   Polyneuropathy 09/30/2016   Hypercholesteremia 06/25/2014   Arthritis 06/25/2014   Onychomycosis 06/25/2014   TOBACCO USER 01/17/2009   Diabetes mellitus type II, uncontrolled (HCC) 12/18/2008   ALCOHOL ABUSE 12/18/2008   CANNABIS ABUSE 12/18/2008   BLINDNESS, LEFT EYE 12/18/2008   PTERYGIUM 12/18/2008   MICROALBUMINURIA 12/18/2008    Past Surgical History:  Procedure Laterality Date   EYE SURGERY Left        Family History  Problem Relation Age of Onset   Colon cancer Neg Hx    Colon polyps Neg Hx    Esophageal cancer Neg Hx    Rectal cancer Neg Hx    Stomach cancer Neg Hx     Social History   Tobacco Use   Smoking status: Former    Packs/day: 0.25    Types: Cigars, Cigarettes    Quit date: 10/2017    Years since quitting: 3.0   Smokeless tobacco: Never   Tobacco comments:    cigars every now and then  Vaping Use   Vaping Use: Never used  Substance Use Topics   Alcohol use: Yes    Comment: occasionally   Drug use: Yes    Types: Marijuana    Comment: daily    Home Medications Prior to Admission medications   Medication Sig Start Date End Date Taking? Authorizing Provider  glipiZIDE (GLUCOTROL)  5 MG tablet Take 0.5 tablets (2.5 mg total) by mouth 2 (two) times daily before a meal. 04/26/19  Yes Beard, Samantha N, DO  metFORMIN (GLUCOPHAGE) 500 MG tablet Take 2 tablets (1,000 mg total) by mouth 2 (two) times daily with a meal. F/u with PCP 08/17/20  Yes [name], DO  acetaminophen (TYLENOL) 500 MG tablet Take 1,000 mg by mouth every 6 (six) hours as needed for moderate pain.    [provider]  glucose blood (ONE TOUCH ULTRA TEST) test strip Dispense QS for twice daily testing 02/22/19   [name], DO  ibuprofen (ADVIL,MOTRIN) 200 MG tablet Take 800 mg by mouth every 6 (six) hours as needed for moderate pain.    [provider]  ipratropium (ATROVENT) 0.06 % nasal spray Place 2 sprays into both nostrils 4 (four) times daily. 04/14/17   Cathie Hoops, Amy V, PA-C  sildenafil (VIAGRA) 25 MG tablet Take 1 tablet (25 mg total) by mouth daily as needed for erectile dysfunction. Take 1 hour prior to sexual activity. 03/19/19   [name], DO    Allergies    Meloxicam  Review of Systems   Review of Systems  Constitutional:  Negative for fever.  HENT:  Negative for congestion, rhinorrhea and sore throat.   Eyes:  Negative for visual disturbance.  Respiratory:  Negative for cough and shortness of breath.   Cardiovascular:  Positive for chest pain.  Gastrointestinal:  Positive for nausea. Negative for abdominal pain, blood in stool, constipation, diarrhea and vomiting.  Neurological:  Positive for dizziness, weakness (generalized weakness), light-headedness and headaches. Negative for numbness.   Physical Exam Updated Vital Signs BP 104/66 (BP Location: Right Arm)   Pulse 62   Temp 98.6 F (37 C) (Oral)   Resp 16   Ht 5\' 8"  (1.727 m)   Wt 74.4 kg   SpO2 98%   BMI 24.94 kg/m   Physical Exam Constitutional:      General: He is not in acute distress.    Appearance: He is well-developed and normal weight. He is not ill-appearing or diaphoretic.  HENT:     Head: Normocephalic and atraumatic.     Mouth/Throat:     Mouth: Mucous membranes are moist.     Pharynx: No oropharyngeal exudate or posterior oropharyngeal erythema.  Eyes:     Extraocular Movements: Extraocular movements intact.     Conjunctiva/sclera: Conjunctivae normal.  Cardiovascular:     Rate and Rhythm: Normal rate and regular rhythm.     Pulses: Normal pulses.     Heart sounds: No murmur heard.   No friction rub. No gallop.  Pulmonary:     Effort: Pulmonary effort is normal. No respiratory distress.     Breath sounds: Normal breath sounds. No wheezing, rhonchi or rales.  Abdominal:     General: Abdomen is flat. Bowel sounds are  normal. There is no distension.     Palpations: Abdomen is soft.     Tenderness: There is no abdominal tenderness. There is no guarding.  Musculoskeletal:     Right lower leg: No edema.     Left lower leg: No edema.  Skin:    General: Skin is warm and dry.  Neurological:     General: No focal deficit present.     Mental Status: He is alert and oriented to person, place, and time. Mental status is at baseline.  Psychiatric:        Mood and Affect: Mood normal.  Behavior: Behavior normal.    ED Results / Procedures / Treatments   Labs (all labs ordered are listed, but only abnormal results are displayed) Labs Reviewed  BASIC METABOLIC PANEL - Abnormal; Notable for the following components:      Result Value   Glucose, Bld 217 (*)    All other components within normal limits  CBC WITH DIFFERENTIAL/PLATELET  TROPONIN I (HIGH SENSITIVITY)  TROPONIN I (HIGH SENSITIVITY)    EKG EKG Interpretation  Date/Time:  Wednesday October 14 2020 09:04:52 EDT Ventricular Rate:  72 PR Interval:  170 QRS Duration: 90 QT Interval:  385 QTC Calculation: 422 R Axis:   91 Text Interpretation: Sinus rhythm Borderline right axis deviation ST elev, probable normal early repol pattern No acute changes Confirmed by Marianna Fuss ([id number]) on 10/14/2020 9:12:12 AM  Radiology DG Chest 2 View  Result Date: 10/14/2020 CLINICAL DATA:  Chest pain, headache and dizziness beginning today. EXAM: CHEST - 2 VIEW COMPARISON:  08/11/2020 FINDINGS: Heart size is normal. Mediastinal shadows are normal. The lungs are clear. The vascularity is normal. No effusions. Ordinary minimal degenerative changes affect the thoracic spine. IMPRESSION: Normal chest. Electronically Signed   By: Paulina Fusi M.D.   On: 10/14/2020 10:01    Procedures Procedures No procedures performed this ED visit.  Medications Ordered in ED Medications  nitroGLYCERIN (NITROSTAT) 0.4 MG SL tablet (  Not Given 10/14/20 1001)  aspirin 81  MG chewable tablet (  Not Given 10/14/20 1000)  nitroGLYCERIN (NITROSTAT) SL tablet 0.4 mg (0.4 mg Sublingual Given 10/14/20 0952)  aspirin chewable tablet 243 mg (243 mg Oral Given 10/14/20 1941)    ED Course  I have reviewed the triage vital signs and the nursing notes.  Pertinent labs & imaging results that were available during my care of the patient were reviewed by me and considered in my medical decision making (see chart for details).    MDM Rules/Calculators/A&P                         James Pratt is a 50 y.o. male with PMHx of T2DM, left eye blindness, GERD, who presents to [facility] ED for chest pain. On arrivals vitals are stable and he is afebrile. EKG with early repolarizations, no ST elevations, no reciprocal depressions. Troponins normal. Heart Pathway score 5. Normal physical exam with exception of mid chest tenderness to palpation. He took baby aspirin prior to coming to ED, was given additional dose for full 324mg  dose. CBC and BMP unremarkable aside from blood glucose 217. CXR without acute cardiopulmonary disease, normal mediastinal borders, no effusions. Patient had some improvement in chest pain with nitroglycerin tablet and aspirin. Likely etiology is MSK secondary to physical activity of pumping bicycle pump vs. GERD vs. demand angina without elevated troponins with presyncopal symptoms. Patient's chest pain resolved in the ED and he is stable for discharge. Patient will follow up with his PCP within the week for follow up of chest pain of unclear etiology.     Final Clinical Impression(s) / ED Diagnoses Final diagnoses:  Chest pain, unspecified type    Rx / DC Orders ED Discharge Orders     None        , MD 10/14/20 1603    12/14/20, MD 10/15/20 1247

## 2020-10-14 NOTE — Discharge Instructions (Addendum)
You were evaluated at  [facility] ED for chest pain. Your EKG and blood work was normal. 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 Please follow up with a primary care physician. Call their office today to request close appointment for recheck within a few days. Please continue to take over the counter pain medication as needed.  If your chest pain worsens and does not improve please seek medical care.  Thank you for allowing [place] to be part of your care.

## 2020-10-14 NOTE — ED Triage Notes (Signed)
Chest pain this morning while at work and took 81 mg Aspirin.

## 2020-12-14 ENCOUNTER — Other Ambulatory Visit: Payer: Self-pay | Admitting: Family Medicine

## 2021-02-24 ENCOUNTER — Other Ambulatory Visit: Payer: Self-pay | Admitting: Student

## 2021-03-24 ENCOUNTER — Encounter (HOSPITAL_BASED_OUTPATIENT_CLINIC_OR_DEPARTMENT_OTHER): Payer: Self-pay | Admitting: Emergency Medicine

## 2021-03-24 ENCOUNTER — Other Ambulatory Visit: Payer: Self-pay

## 2021-03-24 ENCOUNTER — Emergency Department (HOSPITAL_BASED_OUTPATIENT_CLINIC_OR_DEPARTMENT_OTHER)
Admission: EM | Admit: 2021-03-24 | Discharge: 2021-03-24 | Disposition: A | Discharge status: Home / Self Care | Payer: 59 | Attending: Emergency Medicine | Admitting: Emergency Medicine

## 2021-03-24 ENCOUNTER — Emergency Department (HOSPITAL_BASED_OUTPATIENT_CLINIC_OR_DEPARTMENT_OTHER): Payer: 59

## 2021-03-24 DIAGNOSIS — R109 Unspecified abdominal pain: Secondary | ICD-10-CM | POA: Diagnosis not present

## 2021-03-24 DIAGNOSIS — Z20822 Contact with and (suspected) exposure to covid-19: Secondary | ICD-10-CM | POA: Insufficient documentation

## 2021-03-24 DIAGNOSIS — Z7984 Long term (current) use of oral hypoglycemic drugs: Secondary | ICD-10-CM | POA: Diagnosis not present

## 2021-03-24 DIAGNOSIS — R112 Nausea with vomiting, unspecified: Secondary | ICD-10-CM | POA: Diagnosis present

## 2021-03-24 LAB — COMPREHENSIVE METABOLIC PANEL
ALT: 14 U/L (ref 0–44)
AST: 25 U/L (ref 15–41)
Albumin: 4.2 g/dL (ref 3.5–5.0)
Alkaline Phosphatase: 49 U/L (ref 38–126)
Anion gap: 10 (ref 5–15)
BUN: 19 mg/dL (ref 6–20)
CO2: 23 mmol/L (ref 22–32)
Calcium: 8.9 mg/dL (ref 8.9–10.3)
Chloride: 103 mmol/L (ref 98–111)
Creatinine, Ser: 0.86 mg/dL (ref 0.61–1.24)
GFR, Estimated: >60 mL/min (ref >60)
Glucose, Bld: 196 mg/dL — ABNORMAL HIGH (ref 70–99)
Potassium: 4 mmol/L (ref 3.5–5.1)
Sodium: 136 mmol/L (ref 135–145)
Total Bilirubin: 0.6 mg/dL (ref 0.3–1.2)
Total Protein: 6.7 g/dL (ref 6.5–8.1)

## 2021-03-24 LAB — CBC
HCT: 39.8 % (ref 39.0–52.0)
Hemoglobin: 13.9 g/dL (ref 13.0–17.0)
MCH: 30 pg (ref 26.0–34.0)
MCHC: 34.9 g/dL (ref 30.0–36.0)
MCV: 85.8 fL (ref 80.0–100.0)
Platelets: 141 10*3/uL — ABNORMAL LOW (ref 150–400)
RBC: 4.64 MIL/uL (ref 4.22–5.81)
RDW: 12 % (ref 11.5–15.5)
WBC: 6.2 10*3/uL (ref 4.0–10.5)
nRBC: 0 % (ref 0.0–0.2)

## 2021-03-24 LAB — URINALYSIS, ROUTINE W REFLEX MICROSCOPIC
Bilirubin Urine: NEGATIVE
Glucose, UA: NEGATIVE mg/dL
Hgb urine dipstick: NEGATIVE
Ketones, ur: NEGATIVE mg/dL
Leukocytes,Ua: NEGATIVE
Nitrite: NEGATIVE
Specific Gravity, Urine: 1.027 (ref 1.005–1.030)
pH: 6 (ref 5.0–8.0)

## 2021-03-24 LAB — LIPASE, BLOOD: Lipase: 14 U/L (ref 11–51)

## 2021-03-24 LAB — RESP PANEL BY RT-PCR (FLU A&B, COVID) ARPGX2
Influenza A by PCR: NEGATIVE
Influenza B by PCR: NEGATIVE
SARS Coronavirus 2 by RT PCR: NEGATIVE

## 2021-03-24 MED ORDER — FAMOTIDINE IN NACL 20-0.9 MG/50ML-% IV SOLN
20.0000 mg | Freq: Once | INTRAVENOUS | Status: AC
  Administered 2021-03-24: 20 mg via INTRAVENOUS
  Filled 2021-03-24: qty 50

## 2021-03-24 MED ORDER — ONDANSETRON HCL 4 MG PO TABS: 4.0000 mg | ORAL_TABLET | ORAL | 0 refills | Status: DC | PRN

## 2021-03-24 MED ORDER — IOHEXOL 300 MG/ML  SOLN
75.0000 mL | Freq: Once | INTRAMUSCULAR | Status: AC | PRN
  Administered 2021-03-24: 75 mL via INTRAVENOUS

## 2021-03-24 MED ORDER — DICYCLOMINE HCL 20 MG PO TABS: 20.0000 mg | ORAL_TABLET | Freq: Every day | ORAL | 0 refills | Status: DC

## 2021-03-24 MED ORDER — ONDANSETRON HCL 4 MG/2ML IJ SOLN
4.0000 mg | Freq: Once | INTRAMUSCULAR | Status: AC
  Administered 2021-03-24: 4 mg via INTRAVENOUS
  Filled 2021-03-24: qty 2

## 2021-03-24 MED ORDER — SODIUM CHLORIDE 0.9 % IV BOLUS
1000.0000 mL | Freq: Once | INTRAVENOUS | Status: AC
  Administered 2021-03-24: 1000 mL via INTRAVENOUS

## 2021-03-24 MED ORDER — SUCRALFATE 1 G PO TABS: 1.0000 g | ORAL_TABLET | Freq: Three times a day (TID) | ORAL | 0 refills | Status: DC

## 2021-03-24 NOTE — ED Triage Notes (Signed)
Presents for episode of N/V x2 and dizzines this AM, has been experiencing episodes like this for two years, episodes usually in the morning when he first wakes up; endorses poor appetite. Denies abd pain, CP, SOB, diarrhea, blood in stool. Last BM 2 days ago.  H/o DM (metformin).

## 2021-03-24 NOTE — Discharge Instructions (Addendum)
It was a pleasure caring for you today in the emergency department.  Please follow-up with cardiology and gastroenterology.  Please return to the emergency department for any worsening or worrisome symptoms.    There are many causes of abdominal pain. Most pain is not serious and goes away, but some pain gets worse, changes, or will not go away. Please return to the emergency department or see your doctor right away if you (or your family member) experience any of the following:  1. Pain that gets worse or moves to just one spot.  2. Pain that gets worse if you cough or sneeze.  3. Pain with going over a bump in the road.  4. Pain that does not get better in 24 hours.  5. Inability to keep down liquids (vomiting)-especially if you are making less urine.  6. Fainting.  7. Blood in the vomit or stool.  8. High fever or shaking chills.  9. Swelling of the abdomen.  10. Any new or worsening problem.      Follow-up Instructions  See your primary care provider if not completely better in the next 2-3 days. Come to the ED if you are unable to see them in this time frame.    Additional Instructions  No alcohol.  No caffeine, aspirin, or cigarettes.   Please return to the emergency department immediately for any new or concerning symptoms, or if you get worse.

## 2021-03-24 NOTE — ED Notes (Signed)
Patient transported to CT 

## 2021-03-24 NOTE — ED Provider Notes (Signed)
[facility] EMERGENCY DEPT Provider Note   CSN: [id number] Arrival date & time: 03/24/21  8127     History  Chief Complaint  Patient presents with   Emesis    James Pratt is a 51 y.o. male.  This is a 51 y.o. male with significant medical history as below, including DM2, ED, left eye blindness who presents to the ED with complaint of nausea, vomiting, abdominal pain.  Symptom onset approximately 2 to 3 years ago.  Symptoms have been intermittent.  Occur typically once to twice a month.  Patient feels them typically more so in the morning when he wakes up.  Patient will eat a small bite of food response to liquid feel nauseated.  Vomit.  Last time he experienced the symptoms was yesterday, he felt sick on his stomach for most of the day at this point.  Reduced oral intake during this time.  Patient is asymptomatic currently.  He has seen multiple physicians for this problem in the past.  He has not seen gastroenterology previously.  He is not taking medication for the symptoms.  He does report reduced bowel movement over the past 2 days but otherwise no melena or bright blood per rectum.  No rashes.  No chest pain or dyspnea.  No ongoing fevers or chills.  No recent travel or sick contacts.  Abdominal pain described as cramping, generalized.  Currently not experiencing any discomfort.  Patient expresses extreme frustration with this recurrent problem and multiple physicians in the past been unable to figure out what the source of his problem was.      Past Medical History: 12/29/2017: Bilateral knee pain No date: Blind left eye No date: Cataract     Comment:  forming right eye  05/08/2017: Contusion of toenail No date: Depression     Comment:  situational after death of brother  No date: Diabetes mellitus without complication (HCC) No date: GERD (gastroesophageal reflux disease)     Comment:  past hx of GERD- denies currently No date:  Hyperlipidemia 10/03/2016: Orthostatic hypotension     The history is provided by the patient. No language interpreter was used.  Emesis Associated symptoms: abdominal pain   Associated symptoms: no chills, no cough, no fever and no headaches       Home Medications Prior to Admission medications   Medication Sig Start Date End Date Taking? Authorizing Provider  dicyclomine (BENTYL) 20 MG tablet Take 1 tablet (20 mg total) by mouth daily for 5 days. 03/24/21 03/29/21 Yes Tanda Rockers A, DO  ondansetron (ZOFRAN) 4 MG tablet Take 1 tablet (4 mg total) by mouth every 4 (four) hours as needed for nausea or vomiting. 03/24/21  Yes Tanda Rockers A, DO  sucralfate (CARAFATE) 1 g tablet Take 1 tablet (1 g total) by mouth 4 (four) times daily -  with meals and at bedtime for 7 days. 03/24/21 03/31/21 Yes [name], DO  acetaminophen (TYLENOL) 500 MG tablet Take 1,000 mg by mouth every 6 (six) hours as needed for moderate pain.    [provider]  glipiZIDE (GLUCOTROL) 5 MG tablet Take 0.5 tablets (2.5 mg total) by mouth 2 (two) times daily before a meal. 04/26/19   [name], DO  glucose blood (ONE TOUCH ULTRA TEST) test strip Dispense QS for twice daily testing 02/22/19   [name], DO  ibuprofen (ADVIL,MOTRIN) 200 MG tablet Take 800 mg by mouth every 6 (six) hours as needed for moderate pain.  [provider]  ipratropium (ATROVENT) 0.06 % nasal spray Place 2 sprays into both nostrils 4 (four) times daily. 04/14/17   Cathie HoopsYu, Amy V, PA-C  metFORMIN (GLUCOPHAGE) 500 MG tablet TAKE TWO TABLETS TWICE DAILY WITH MEALS 02/24/21   Alicia AmelSanford, James B, MD  sildenafil (VIAGRA) 25 MG tablet Take 1 tablet (25 mg total) by mouth daily as needed for erectile dysfunction. Take 1 hour prior to sexual activity. 03/19/19   Allayne StackBeard, Samantha N, DO      Allergies    Meloxicam    Review of Systems   Review of Systems  Constitutional:  Negative for chills and fever.  HENT:  Negative for  facial swelling and trouble swallowing.   Eyes:  Negative for photophobia and visual disturbance.  Respiratory:  Negative for cough and shortness of breath.   Cardiovascular:  Negative for chest pain and palpitations.  Gastrointestinal:  Positive for abdominal pain, constipation, nausea and vomiting.  Endocrine: Negative for polydipsia and polyuria.  Genitourinary:  Negative for difficulty urinating and hematuria.  Musculoskeletal:  Negative for gait problem and joint swelling.  Skin:  Negative for pallor and rash.  Neurological:  Negative for syncope and headaches.  Psychiatric/Behavioral:  Negative for agitation and confusion.    Physical Exam Updated Vital Signs BP 113/80    Pulse 61    Temp 97.8 F (36.6 C) (Oral)    Resp 11    Wt 74.4 kg    SpO2 98%    BMI 24.94 kg/m  Physical Exam Vitals and nursing note reviewed.  Constitutional:      General: He is not in acute distress.    Appearance: Normal appearance. He is well-developed. He is not ill-appearing, toxic-appearing or diaphoretic.  HENT:     Head: Normocephalic and atraumatic.     Right Ear: External ear normal.     Left Ear: External ear normal.     Mouth/Throat:     Mouth: Mucous membranes are moist.  Eyes:     General: No scleral icterus. Cardiovascular:     Rate and Rhythm: Normal rate and regular rhythm.     Pulses: Normal pulses.     Heart sounds: Normal heart sounds.  Pulmonary:     Effort: Pulmonary effort is normal. No respiratory distress.     Breath sounds: Normal breath sounds. No wheezing.  Abdominal:     General: Abdomen is flat.     Palpations: Abdomen is soft.     Tenderness: There is no abdominal tenderness.  Musculoskeletal:        General: Normal range of motion.     Cervical back: Normal range of motion.     Right lower leg: No edema.     Left lower leg: No edema.  Skin:    General: Skin is warm and dry.     Capillary Refill: Capillary refill takes less than 2 seconds.  Neurological:      Mental Status: He is alert and oriented to person, place, and time.  Psychiatric:        Mood and Affect: Mood normal.        Behavior: Behavior normal.    ED Results / Procedures / Treatments   Labs (all labs ordered are listed, but only abnormal results are displayed) Labs Reviewed  COMPREHENSIVE METABOLIC PANEL - Abnormal; Notable for the following components:      Result Value   Glucose, Bld 196 (*)    All other components within normal limits  CBC -  Abnormal; Notable for the following components:   Platelets 141 (*)    All other components within normal limits  URINALYSIS, ROUTINE W REFLEX MICROSCOPIC - Abnormal; Notable for the following components:   Color, Urine COLORLESS (*)    Protein, ur TRACE (*)    All other components within normal limits  RESP PANEL BY RT-PCR (FLU A&B, COVID) ARPGX2  LIPASE, BLOOD    EKG EKG Interpretation  Date/Time:  Wednesday March 24 2021 06:43:07 EST Ventricular Rate:  75 PR Interval:  161 QRS Duration: 90 QT Interval:  391 QTC Calculation: 437 R Axis:   92 Text Interpretation: Sinus rhythm Borderline right axis deviation Confirmed by Geoffery Lyons ([id number]) on 03/24/2021 6:44:39 AM  Radiology CT ABDOMEN PELVIS W CONTRAST  Result Date: 03/24/2021 CLINICAL DATA:  Episodic abdominal pain, nausea vomiting. EXAM: CT ABDOMEN AND PELVIS WITH CONTRAST TECHNIQUE: Multidetector CT imaging of the abdomen and pelvis was performed using the standard protocol following bolus administration of intravenous contrast. RADIATION DOSE REDUCTION: This exam was performed according to the departmental dose-optimization program which includes automated exposure control, adjustment of the mA and/or kV according to patient size and/or use of iterative reconstruction technique. CONTRAST:  75mL OMNIPAQUE IOHEXOL 300 MG/ML  SOLN COMPARISON:  11/14/2017 FINDINGS: Lower chest: The lung bases are clear of acute process. No pleural effusion or pulmonary lesions. The heart  is normal in size. No pericardial effusion. The distal esophagus and aorta are unremarkable. Hepatobiliary: No focal hepatic lesions or intrahepatic biliary dilatation. The gallbladder is normal. No common bile duct dilatation. Pancreas: No mass, inflammation ductal dilatation. Spleen: Normal size.  No focal lesions. Adrenals/Urinary Tract: Adrenal glands and kidneys are unremarkable. No renal lesions or renal calculi. No hydronephrosis. The bladder is unremarkable. Stomach/Bowel: The stomach, duodenum, small bowel and colon are unremarkable. No acute inflammatory process, mass lesions or obstructive findings. The terminal ileum and appendix are normal. Vascular/Lymphatic: The aorta is normal in caliber. No dissection. Scattered age advanced atherosclerotic calcifications. The branch vessels are patent. The major venous structures are patent. No mesenteric or retroperitoneal mass or adenopathy. Small scattered lymph nodes are noted. Reproductive: The prostate gland and seminal vesicles are unremarkable. Other: No pelvic mass or adenopathy. No free pelvic fluid collections. No inguinal mass or adenopathy. No abdominal wall hernia or subcutaneous lesions. Musculoskeletal: No significant bony findings. IMPRESSION: 1. No acute abdominal/pelvic findings, mass lesions or adenopathy. 2. Age advanced atherosclerotic calcifications involving the aorta and branch vessels. Electronically Signed   By: Rudie Meyer M.D.   On: 03/24/2021 08:12    Procedures Procedures    Medications Ordered in ED Medications  sodium chloride 0.9 % bolus 1,000 mL (0 mLs Intravenous Stopped 03/24/21 0937)  ondansetron (ZOFRAN) injection 4 mg (4 mg Intravenous Given 03/24/21 0739)  famotidine (PEPCID) IVPB 20 mg premix (0 mg Intravenous Stopped 03/24/21 0903)  iohexol (OMNIPAQUE) 300 MG/ML solution 75 mL (75 mLs Intravenous Contrast Given 03/24/21 0746)    ED Course/ Medical Decision Making/ A&P                           Medical  Decision Making Amount and/or Complexity of Data Reviewed Labs: ordered. Radiology: ordered.  Risk Prescription drug management.    CC: n/v  This patient complains of above; this involves an extensive number of treatment options and is a complaint that carries with it a high risk of complications and morbidity. Vital signs were reviewed. Serious etiologies considered.  Record  review:  Previous records obtained and reviewed   Work up as above, notable for:  Labs & imaging results that were available during my care of the patient were reviewed by me and considered in my medical decision making.   I ordered imaging studies which included CT AP and I independently visualized and interpreted imaging which showed advanced atherosclerosis, no acute intra-abdominal abnormalities   Discussed the importance of marijuana cessation to the patient.  In the setting of chronic diabetes, chronic marijuana use; gastroparesis is of concern. Serious etiology considered.  Management: Zofran, fluid bolus Pepcid  Reassessment:  Patient is asymptomatic at this time.  Etiology patient's symptoms is unclear at this time, there is concern for gastroparesis given history of diabetes and frequent marijuana use.  Cannabinoid hyperemesis syndrome is also on differential.  Recommend bland diet, marijuana cessation, outpatient gastroenterology follow-up.  Cardiac monitor reviewed by myself demonstrates normal sinus rhythm.  Given CT findings of atherosclerotic disease rec'd patient follow-up with cardiology.  He has no chest pain at this time or anginal equivalents.    The patient's overall condition has improved, the patient presents with abdominal pain without signs of peritonitis, or other life-threatening serious etiology. Detailed discussions were had with the patient regarding current findings, and need for close f/u with PCP or on call doctor. The patient appears stable for discharge and has been  instructed to return immediately if the symptoms worsen in any way for re-evaluation. Patient verbalized understanding and is in agreement with current care plan.  All questions answered prior to discharge.    This chart was dictated using voice recognition software.  Despite best efforts to proofread,  errors can occur which can change the documentation meaning.         Final Clinical Impression(s) / ED Diagnoses Final diagnoses:  Abdominal pain, unspecified abdominal location  Nausea and vomiting, unspecified vomiting type    Rx / DC Orders ED Discharge Orders          Ordered    sucralfate (CARAFATE) 1 g tablet  3 times daily with meals & bedtime        03/24/21 0949    ondansetron (ZOFRAN) 4 MG tablet  Every 4 hours PRN        03/24/21 0949    dicyclomine (BENTYL) 20 MG tablet  Daily        03/24/21 0949              Jeanell Sparrow, DO 03/24/21 1047

## 2021-03-30 DIAGNOSIS — F419 Anxiety disorder, unspecified: Secondary | ICD-10-CM | POA: Diagnosis present

## 2021-04-13 ENCOUNTER — Other Ambulatory Visit: Payer: Self-pay

## 2021-04-13 ENCOUNTER — Emergency Department (HOSPITAL_BASED_OUTPATIENT_CLINIC_OR_DEPARTMENT_OTHER)
Admission: EM | Admit: 2021-04-13 | Discharge: 2021-04-13 | Disposition: A | Discharge status: Home / Self Care | Payer: 59 | Attending: Emergency Medicine | Admitting: Emergency Medicine

## 2021-04-13 ENCOUNTER — Encounter (HOSPITAL_BASED_OUTPATIENT_CLINIC_OR_DEPARTMENT_OTHER): Payer: Self-pay | Admitting: Emergency Medicine

## 2021-04-13 DIAGNOSIS — K5641 Fecal impaction: Secondary | ICD-10-CM | POA: Insufficient documentation

## 2021-04-13 DIAGNOSIS — E119 Type 2 diabetes mellitus without complications: Secondary | ICD-10-CM | POA: Diagnosis not present

## 2021-04-13 DIAGNOSIS — Z7984 Long term (current) use of oral hypoglycemic drugs: Secondary | ICD-10-CM | POA: Diagnosis not present

## 2021-04-13 DIAGNOSIS — T478X5A Adverse effect of other agents primarily affecting gastrointestinal system, initial encounter: Secondary | ICD-10-CM | POA: Insufficient documentation

## 2021-04-13 DIAGNOSIS — Z87891 Personal history of nicotine dependence: Secondary | ICD-10-CM | POA: Insufficient documentation

## 2021-04-13 DIAGNOSIS — T50905A Adverse effect of unspecified drugs, medicaments and biological substances, initial encounter: Secondary | ICD-10-CM

## 2021-04-13 DIAGNOSIS — R1084 Generalized abdominal pain: Secondary | ICD-10-CM | POA: Diagnosis present

## 2021-04-13 DIAGNOSIS — Z79899 Other long term (current) drug therapy: Secondary | ICD-10-CM | POA: Diagnosis not present

## 2021-04-13 MED ORDER — LIDOCAINE HCL URETHRAL/MUCOSAL 2 % EX GEL
1.0000 "application " | Freq: Once | CUTANEOUS | Status: AC
  Administered 2021-04-13: 1 via TOPICAL
  Filled 2021-04-13: qty 11

## 2021-04-13 NOTE — ED Triage Notes (Signed)
States is having stomach cramping and not able to a BM. Last BM 3-4 days ago

## 2021-04-13 NOTE — Discharge Instructions (Signed)
Your recently prescribed Bentyl (dicyclomine) and Carafate (sucralfate) both of which are known to cause constipation.  Please discontinue these drugs as they may have caused her fecal impaction.

## 2021-04-13 NOTE — ED Provider Notes (Signed)
[facility] EMERGENCY Provider Note: James Dell, MD, FACEP  CSN: [id number] MRN: [id number] ARRIVAL: 04/13/21 at 0443 ROOM: DB011/DB011   CHIEF COMPLAINT  Abdominal Pain   HISTORY OF PRESENT ILLNESS  04/13/21 4:55 AM James Pratt is a 51 y.o. male with a history of diabetes.  He is here with 4 days of lower abdominal pain which is both constant and crampy in nature.  He rates it as a 9 out of 10.  He has also not had a bowel movement in 4 days.  When he did move his bowels before that the stools are very hard and required a lot of fall Salva force for elimination.  He states his rectal area hurts so badly he is having difficulty sitting straight.  He has not had nausea or vomiting with this.  He was seen in the ED on 03/24/2021 but on that occasion he was having nausea and vomiting with the pain.  A CT at that time was unremarkable.   Past Medical History:  Diagnosis Date   Bilateral knee pain 12/29/2017   Blind left eye    Cataract    forming right eye    Contusion of toenail 05/08/2017   Depression    situational after death of brother    Diabetes mellitus without complication (HCC)    GERD (gastroesophageal reflux disease)    past hx of GERD- denies currently   Hyperlipidemia    Orthostatic hypotension 10/03/2016    Past Surgical History:  Procedure Laterality Date   EYE SURGERY Left     Family History  Problem Relation Age of Onset   Colon cancer Neg Hx    Colon polyps Neg Hx    Esophageal cancer Neg Hx    Rectal cancer Neg Hx    Stomach cancer Neg Hx     Social History   Tobacco Use   Smoking status: Former    Packs/day: 0.25    Types: Cigars, Cigarettes    Quit date: 10/2017    Years since quitting: 3.5   Smokeless tobacco: Never   Tobacco comments:    cigars every now and then  Vaping Use   Vaping Use: Never used  Substance Use Topics   Alcohol use: Yes    Comment: occasionally   Drug use: Yes    Types: Marijuana    Comment: daily     Prior to Admission medications   Medication Sig Start Date End Date Taking? Authorizing Provider  acetaminophen (TYLENOL) 500 MG tablet Take 1,000 mg by mouth every 6 (six) hours as needed for moderate pain.    [provider]  glipiZIDE (GLUCOTROL) 5 MG tablet Take 0.5 tablets (2.5 mg total) by mouth 2 (two) times daily before a meal. 04/26/19   [name], DO  glucose blood (ONE TOUCH ULTRA TEST) test strip Dispense QS for twice daily testing 02/22/19   [name], DO  ibuprofen (ADVIL,MOTRIN) 200 MG tablet Take 800 mg by mouth every 6 (six) hours as needed for moderate pain.    [provider]  ipratropium (ATROVENT) 0.06 % nasal spray Place 2 sprays into both nostrils 4 (four) times daily. 04/14/17   Cathie Hoops, Amy V, PA-C  metFORMIN (GLUCOPHAGE) 500 MG tablet TAKE TWO TABLETS TWICE DAILY WITH MEALS 02/24/21   [name], MD  ondansetron (ZOFRAN) 4 MG tablet Take 1 tablet (4 mg total) by mouth every 4 (four) hours as needed for nausea or vomiting. 03/24/21   Tanda Rockers  A, DO  sildenafil (VIAGRA) 25 MG tablet Take 1 tablet (25 mg total) by mouth daily as needed for erectile dysfunction. Take 1 hour prior to sexual activity. 03/19/19   [name], DO    Allergies Meloxicam   REVIEW OF SYSTEMS  Negative except as noted here or in the History of Present Illness.   PHYSICAL EXAMINATION  Initial Vital Signs Blood pressure (!) 130/96, pulse 90, temperature 97.7 F (36.5 C), temperature source Oral, resp. rate 18, height 5\' 8"  (1.727 m), weight 70.3 kg, SpO2 100 %.  Examination General: Well-developed, well-nourished male in no acute distress; appearance consistent with age of record HENT: normocephalic; atraumatic Eyes: Right pupil round and reactive to light; left corneal opacity Neck: supple Heart: regular rate and rhythm Lungs: clear to auscultation bilaterally Abdomen: soft; nondistended; diffuse tenderness, primarily in the lower abdomen;  bowel sounds present Rectal: Soft fecal impaction Extremities: No deformity; full range of motion; pulses normal Neurologic: Awake, alert and oriented; motor function intact in all extremities and symmetric; no facial droop Skin: Warm and dry Psychiatric: Normal mood and affect   RESULTS  Summary of this visit's results, reviewed and interpreted by myself:   EKG Interpretation  Date/Time:    Ventricular Rate:    PR Interval:    QRS Duration:   QT Interval:    QTC Calculation:   R Axis:     Text Interpretation:         Laboratory Studies: No results found for this or any previous visit (from the past 24 hour(s)). Imaging Studies: No results found.  ED COURSE and MDM  Nursing notes, initial and subsequent vitals signs, including pulse oximetry, reviewed and interpreted by myself.  Vitals:   04/13/21 0450 04/13/21 0451  BP:  (!) 130/96  Pulse:  90  Resp:  18  Temp:  97.7 F (36.5 C)  TempSrc:  Oral  SpO2:  100%  Weight: 70.3 kg   Height: 5\' 8"  (1.727 m)    Medications  lidocaine (XYLOCAINE) 2 % jelly 1 application (1 application Topical Given 04/13/21 0514)   The patient has a rectal fecal impaction on physical exam.  This would explain both his rectal pain and his constipation and abdominal pain.  Because he had a normal CT scan of the abdomen and pelvis on the 18th of last month I have a low suspicion of tumor or other cause of obstruction.  A soapsuds enema has been ordered.  6:29 AM Patient passed a large volume of stool after being administered an enema.  He feels significant relief.  He has not been on narcotics recently but was placed on sucralfate and Bentyl at his previous ED visit.  Both of these drugs can cause constipation.  He will be advised to discontinue them.  PROCEDURES  Procedures   ED DIAGNOSES     ICD-10-CM   1. Fecal impaction in rectum (HCC)  K56.41     2. Adverse reaction to drug in therapeutic use  T50.905A          Clemmie Marxen, 06/11/21,  MD 04/13/21 [phone]

## 2021-06-20 ENCOUNTER — Emergency Department (HOSPITAL_BASED_OUTPATIENT_CLINIC_OR_DEPARTMENT_OTHER)
Admission: EM | Admit: 2021-06-20 | Discharge: 2021-06-21 | Disposition: A | Discharge status: Home / Self Care | Payer: 59 | Attending: Emergency Medicine | Admitting: Emergency Medicine

## 2021-06-20 ENCOUNTER — Other Ambulatory Visit: Payer: Self-pay

## 2021-06-20 ENCOUNTER — Encounter (HOSPITAL_BASED_OUTPATIENT_CLINIC_OR_DEPARTMENT_OTHER): Payer: Self-pay

## 2021-06-20 ENCOUNTER — Emergency Department (HOSPITAL_BASED_OUTPATIENT_CLINIC_OR_DEPARTMENT_OTHER): Payer: 59 | Admitting: Radiology

## 2021-06-20 DIAGNOSIS — E119 Type 2 diabetes mellitus without complications: Secondary | ICD-10-CM | POA: Insufficient documentation

## 2021-06-20 DIAGNOSIS — Z7984 Long term (current) use of oral hypoglycemic drugs: Secondary | ICD-10-CM | POA: Insufficient documentation

## 2021-06-20 DIAGNOSIS — K5901 Slow transit constipation: Secondary | ICD-10-CM | POA: Insufficient documentation

## 2021-06-20 DIAGNOSIS — R519 Headache, unspecified: Secondary | ICD-10-CM

## 2021-06-20 LAB — CBC WITH DIFFERENTIAL/PLATELET
Abs Immature Granulocytes: 0.02 10*3/uL (ref 0.00–0.07)
Basophils Absolute: 0 10*3/uL (ref 0.0–0.1)
Basophils Relative: 1 %
Eosinophils Absolute: 0.2 10*3/uL (ref 0.0–0.5)
Eosinophils Relative: 3 %
HCT: 43.8 % (ref 39.0–52.0)
Hemoglobin: 14.6 g/dL (ref 13.0–17.0)
Immature Granulocytes: 0 %
Lymphocytes Relative: 30 %
Lymphs Abs: 1.9 10*3/uL (ref 0.7–4.0)
MCH: 29.3 pg (ref 26.0–34.0)
MCHC: 33.3 g/dL (ref 30.0–36.0)
MCV: 88 fL (ref 80.0–100.0)
Monocytes Absolute: 0.5 10*3/uL (ref 0.1–1.0)
Monocytes Relative: 8 %
Neutro Abs: 3.7 10*3/uL (ref 1.7–7.7)
Neutrophils Relative %: 58 %
Platelets: 169 10*3/uL (ref 150–400)
RBC: 4.98 MIL/uL (ref 4.22–5.81)
RDW: 12.3 % (ref 11.5–15.5)
WBC: 6.4 10*3/uL (ref 4.0–10.5)
nRBC: 0 % (ref 0.0–0.2)

## 2021-06-20 LAB — COMPREHENSIVE METABOLIC PANEL
ALT: 14 U/L (ref 0–44)
AST: 15 U/L (ref 15–41)
Albumin: 4.5 g/dL (ref 3.5–5.0)
Alkaline Phosphatase: 57 U/L (ref 38–126)
Anion gap: 11 (ref 5–15)
BUN: 19 mg/dL (ref 6–20)
CO2: 25 mmol/L (ref 22–32)
Calcium: 9.7 mg/dL (ref 8.9–10.3)
Chloride: 102 mmol/L (ref 98–111)
Creatinine, Ser: 0.91 mg/dL (ref 0.61–1.24)
GFR, Estimated: >60 mL/min (ref >60)
Glucose, Bld: 180 mg/dL — ABNORMAL HIGH (ref 70–99)
Potassium: 3.8 mmol/L (ref 3.5–5.1)
Sodium: 138 mmol/L (ref 135–145)
Total Bilirubin: 0.3 mg/dL (ref 0.3–1.2)
Total Protein: 7.2 g/dL (ref 6.5–8.1)

## 2021-06-20 LAB — URINALYSIS, ROUTINE W REFLEX MICROSCOPIC
Bilirubin Urine: NEGATIVE
Glucose, UA: >1000 mg/dL — AB
Hgb urine dipstick: NEGATIVE
Ketones, ur: NEGATIVE mg/dL
Leukocytes,Ua: NEGATIVE
Nitrite: NEGATIVE
Specific Gravity, Urine: 1.038 — ABNORMAL HIGH (ref 1.005–1.030)
pH: 6.5 (ref 5.0–8.0)

## 2021-06-20 LAB — LIPASE, BLOOD: Lipase: 21 U/L (ref 11–51)

## 2021-06-20 MED ORDER — PROCHLORPERAZINE EDISYLATE 10 MG/2ML IJ SOLN
10.0000 mg | Freq: Once | INTRAMUSCULAR | Status: AC
  Administered 2021-06-20: 10 mg via INTRAVENOUS
  Filled 2021-06-20: qty 2

## 2021-06-20 MED ORDER — DIPHENHYDRAMINE HCL 50 MG/ML IJ SOLN
25.0000 mg | Freq: Once | INTRAMUSCULAR | Status: AC
  Administered 2021-06-20: 25 mg via INTRAVENOUS
  Filled 2021-06-20: qty 1

## 2021-06-20 NOTE — ED Triage Notes (Signed)
Pt reports having abdominal on/off x 2 weeks ?C/O a lot of gas. + nausea ?Also reports urinary frequency and headaches ?

## 2021-06-21 MED ORDER — POLYETHYLENE GLYCOL 3350 17 G PO PACK
17.0000 g | PACK | Freq: Two times a day (BID) | ORAL | 0 refills | Status: DC
Start: 2021-06-21 — End: 2024-01-03

## 2021-06-21 NOTE — ED Provider Notes (Signed)
?[facility] EMERGENCY DEPT ?Provider Note ? ? ?CSN: [id number] ?Arrival date & time: 06/20/21  1936 ? ?  ? ?History ? ?Chief Complaint  ?Patient presents with  ? Abdominal Pain  ? ? ?James Pratt is a 51 y.o. male. ? ?HPI ? ?  ? ?This a 51 year old male with a prediabetes and hyperlipidemia who presents with abdominal pain and headache.  Has a history of headaches in the past.  He states he is presented to the hospital and has had a migraine cocktail which is helped.  He states over the last week he has had persistent occipital headaches.  It does improve sometimes with Tylenol.  However it comes back.  Denies any new vision changes, weakness, numbness, strokelike symptoms.  He is never seen a neurologist.  No diagnosis of prior migraines.  Patient also reports abdominal pain.  Patient reports gas and bloating.  Has reported nausea without vomiting.  This has been over the last 2 weeks.  He states his bowels are "not normal" but he does not feel that he is as constipated as he was in the past.  He states he is having a bowel movement about every other day.  No diarrhea.  No fevers. ? ?Home Medications ?Prior to Admission medications   ?Medication Sig Start Date End Date Taking? Authorizing Provider  ?polyethylene glycol (MIRALAX / GLYCOLAX) 17 g packet Take 17 g by mouth 2 (two) times daily. 06/21/21  Yes Porcia Morganti, [name], MD  ?acetaminophen (TYLENOL) 500 MG tablet Take 1,000 mg by mouth every 6 (six) hours as needed for moderate pain.    [provider]  ?glipiZIDE (GLUCOTROL) 5 MG tablet Take 0.5 tablets (2.5 mg total) by mouth 2 (two) times daily before a meal. 04/26/19   [name], DO  ?glucose blood (ONE TOUCH ULTRA TEST) test strip Dispense QS for twice daily testing 02/22/19   [name], DO  ?ibuprofen (ADVIL,MOTRIN) 200 MG tablet Take 800 mg by mouth every 6 (six) hours as needed for moderate pain.    [provider]  ?ipratropium (ATROVENT) 0.06 %  nasal spray Place 2 sprays into both nostrils 4 (four) times daily. 04/14/17   Cathie Hoops, Amy V, PA-C  ?metFORMIN (GLUCOPHAGE) 500 MG tablet TAKE TWO TABLETS TWICE DAILY WITH MEALS 02/24/21   [name], MD  ?ondansetron (ZOFRAN) 4 MG tablet Take 1 tablet (4 mg total) by mouth every 4 (four) hours as needed for nausea or vomiting. 03/24/21   [name], DO  ?sildenafil (VIAGRA) 25 MG tablet Take 1 tablet (25 mg total) by mouth daily as needed for erectile dysfunction. Take 1 hour prior to sexual activity. 03/19/19   [name], DO  ?   ? ?Allergies    ?Meloxicam   ? ?Review of Systems   ?Review of Systems  ?Constitutional:  Negative for fever.  ?Respiratory:  Negative for shortness of breath.   ?Cardiovascular:  Negative for chest pain.  ?Gastrointestinal:  Positive for abdominal pain, constipation and nausea. Negative for diarrhea and vomiting.  ?Neurological:  Positive for headaches.  ?All other systems reviewed and are negative. ? ?Physical Exam ?Updated Vital Signs ?BP 131/81 (BP Location: Left Arm)   Pulse 63   Temp 98.4 ?F (36.9 ?C)   Resp 17   Ht 1.727 m (5\' 8" )   Wt 72.6 kg   SpO2 99%   BMI 24.33 kg/m?  ?Physical Exam ?Vitals and nursing note reviewed.  ?Constitutional:   ?  Appearance: He is well-developed. He is not ill-appearing.  ?HENT:  ?   Head: Normocephalic and atraumatic.  ?   Mouth/Throat:  ?   Mouth: Mucous membranes are moist.  ?Eyes:  ?   Comments: Left pupil nonreactive  ?Cardiovascular:  ?   Rate and Rhythm: Normal rate and regular rhythm.  ?   Heart sounds: Normal heart sounds. No murmur heard. ?Pulmonary:  ?   Effort: Pulmonary effort is normal. No respiratory distress.  ?   Breath sounds: Normal breath sounds. No wheezing.  ?Abdominal:  ?   General: Bowel sounds are normal.  ?   Palpations: Abdomen is soft.  ?   Tenderness: There is no abdominal tenderness. There is no guarding or rebound.  ?Musculoskeletal:  ?   Cervical back: Neck supple.  ?Lymphadenopathy:  ?   Cervical: No  cervical adenopathy.  ?Skin: ?   General: Skin is warm and dry.  ?Neurological:  ?   Mental Status: He is alert and oriented to person, place, and time.  ?   Comments: Cranial nerves II through XII intact, 5 out of 5 strength in all 4 extremities, no dysmetria to finger-nose-finger  ?Psychiatric:     ?   Mood and Affect: Mood normal.  ? ? ?ED Results / Procedures / Treatments   ?Labs ?(all labs ordered are listed, but only abnormal results are displayed) ?Labs Reviewed  ?COMPREHENSIVE METABOLIC PANEL - Abnormal; Notable for the following components:  ?    Result Value  ? Glucose, Bld 180 (*)   ? All other components within normal limits  ?URINALYSIS, ROUTINE W REFLEX MICROSCOPIC - Abnormal; Notable for the following components:  ? Specific Gravity, Urine 1.038 (*)   ? Glucose, UA >1,000 (*)   ? Protein, ur TRACE (*)   ? Bacteria, UA RARE (*)   ? All other components within normal limits  ?CBC WITH DIFFERENTIAL/PLATELET  ?LIPASE, BLOOD  ? ? ?EKG ?None ? ?Radiology ?DG Abdomen 1 View ? ?Result Date: 06/21/2021 ?CLINICAL DATA:  Increased gas and nausea x2 weeks. EXAM: ABDOMEN - 1 VIEW COMPARISON:  None. FINDINGS: The bowel gas pattern is normal. A large amount of stool is seen throughout the large bowel. No radio-opaque calculi or other significant radiographic abnormality are seen. IMPRESSION: Large stool burden, without evidence for bowel obstruction. Electronically Signed   By: Aram Candela M.D.   On: 06/21/2021 00:27   ? ?Procedures ?Procedures  ? ? ?Medications Ordered in ED ?Medications  ?prochlorperazine (COMPAZINE) injection 10 mg (10 mg Intravenous Given 06/20/21 2359)  ?diphenhydrAMINE (BENADRYL) injection 25 mg (25 mg Intravenous Given 06/20/21 2359)  ? ? ?ED Course/ Medical Decision Making/ A&P ?  ?                        ?Medical Decision Making ?Amount and/or Complexity of Data Reviewed ?Labs: ordered. ?Radiology: ordered. ? ?Risk ?Prescription drug management. ? ? ?This patient presents to the ED for  concern of headache, abdominal pain, this involves an extensive number of treatment options, and is a complaint that carries with it a high risk of complications and morbidity.  The differential diagnosis includes migraines, tension headache, less likely intracranial bleed, ongoing constipation, less likely obstruction, cholecystitis, appendicitis ? ?MDM:   ? ?This is a 51 year old male who presents with headache and abdominal pain.  He is nontoxic and vital signs are reassuring.  His abdominal exam is benign.  Neurologic exam is normal.  Reports a  history of the same.  I reviewed his chart.  He has had several recent visits for abdominal pain.  Had fecal impaction and constipation.  He was referred as an outpatient to Dr. Elnoria HowardHung.  He denies being on a bowel regimen at this time.  Labs obtained and reviewed and are reassuring.  Obtained a KUB.  He does have a large stool burden.  I suspect he needs to be on an ongoing bowel regimen.  Will start on MiraLAX and refer back to Dr. Elnoria HowardHung.  Given benign exam, do not feel he needs further advanced imaging.  Additionally, neurologic exam is reassuring.  He denies any formal history of headaches or migraines, although he does endorse recurrent headaches.  We will give follow-up with neurology.  Headache improved with migraine cocktail. ?(Labs, imaging) ? ?Labs: ?I Ordered, and personally interpreted labs.  The pertinent results include: CBC, CMP, lipase, urinalysis reassuring ? ?Imaging Studies ordered: ?I ordered imaging studies including KUB with large stool burden ?I independently visualized and interpreted imaging. ?I agree with the radiologist interpretation ? ?Additional history obtained from chart review.  External records from outside source obtained and reviewed including prior ER visits and outpatient evaluations by primary physicians ? ?Critical Interventions: ?IV Compazine and Benadryl ? ?Consultations: ?I requested consultation with the NA,  and discussed lab and  imaging findings as well as pertinent plan - they recommend: N/A ? ?Cardiac Monitoring: ?The patient was maintained on a cardiac monitor.  I personally viewed and interpreted the cardiac monitored which showed a

## 2021-06-21 NOTE — Discharge Instructions (Addendum)
You were seen today for headache and abdominal pain.  It does appear that you are constipated on your x-ray.  Start MiraLAX twice daily.  Follow-up again with Dr. Benson Norway.  You may need to be on a chronic bowel regimen.  Given your ongoing headaches, you should follow-up with neurology.  Information provided. ?

## 2021-08-03 ENCOUNTER — Ambulatory Visit: Payer: 59 | Admitting: Psychiatry

## 2021-08-30 ENCOUNTER — Ambulatory Visit: Payer: 59 | Admitting: Psychiatry
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
## 2022-08-24 ENCOUNTER — Emergency Department (HOSPITAL_BASED_OUTPATIENT_CLINIC_OR_DEPARTMENT_OTHER)
Admission: EM | Admit: 2022-08-24 | Discharge: 2022-08-24 | Disposition: A | Discharge status: Home / Self Care | Payer: 59 | Attending: Emergency Medicine | Admitting: Emergency Medicine

## 2022-08-24 ENCOUNTER — Other Ambulatory Visit: Payer: Self-pay

## 2022-08-24 ENCOUNTER — Encounter (HOSPITAL_BASED_OUTPATIENT_CLINIC_OR_DEPARTMENT_OTHER): Payer: Self-pay | Admitting: Emergency Medicine

## 2022-08-24 DIAGNOSIS — R519 Headache, unspecified: Secondary | ICD-10-CM | POA: Diagnosis present

## 2022-08-24 DIAGNOSIS — E119 Type 2 diabetes mellitus without complications: Secondary | ICD-10-CM | POA: Diagnosis not present

## 2022-08-24 DIAGNOSIS — Z7984 Long term (current) use of oral hypoglycemic drugs: Secondary | ICD-10-CM | POA: Diagnosis not present

## 2022-08-24 DIAGNOSIS — R42 Dizziness and giddiness: Secondary | ICD-10-CM | POA: Diagnosis not present

## 2022-08-24 LAB — COMPREHENSIVE METABOLIC PANEL
ALT: 12 U/L (ref 0–44)
AST: 16 U/L (ref 15–41)
Albumin: 4.2 g/dL (ref 3.5–5.0)
Alkaline Phosphatase: 51 U/L (ref 38–126)
Anion gap: 8 (ref 5–15)
BUN: 21 mg/dL — ABNORMAL HIGH (ref 6–20)
CO2: 26 mmol/L (ref 22–32)
Calcium: 9.6 mg/dL (ref 8.9–10.3)
Chloride: 99 mmol/L (ref 98–111)
Creatinine, Ser: 1.02 mg/dL (ref 0.61–1.24)
GFR, Estimated: >60 mL/min (ref >60)
Glucose, Bld: 150 mg/dL — ABNORMAL HIGH (ref 70–99)
Potassium: 4.4 mmol/L (ref 3.5–5.1)
Sodium: 133 mmol/L — ABNORMAL LOW (ref 135–145)
Total Bilirubin: 0.5 mg/dL (ref 0.3–1.2)
Total Protein: 6.8 g/dL (ref 6.5–8.1)

## 2022-08-24 LAB — CBC WITH DIFFERENTIAL/PLATELET
Abs Immature Granulocytes: 0.02 10*3/uL (ref 0.00–0.07)
Basophils Absolute: 0 10*3/uL (ref 0.0–0.1)
Basophils Relative: 1 %
Eosinophils Absolute: 0.1 10*3/uL (ref 0.0–0.5)
Eosinophils Relative: 1 %
HCT: 40.3 % (ref 39.0–52.0)
Hemoglobin: 14 g/dL (ref 13.0–17.0)
Immature Granulocytes: 0 %
Lymphocytes Relative: 30 %
Lymphs Abs: 1.5 10*3/uL (ref 0.7–4.0)
MCH: 30 pg (ref 26.0–34.0)
MCHC: 34.7 g/dL (ref 30.0–36.0)
MCV: 86.3 fL (ref 80.0–100.0)
Monocytes Absolute: 0.4 10*3/uL (ref 0.1–1.0)
Monocytes Relative: 8 %
Neutro Abs: 3.1 10*3/uL (ref 1.7–7.7)
Neutrophils Relative %: 60 %
Platelets: 165 10*3/uL (ref 150–400)
RBC: 4.67 MIL/uL (ref 4.22–5.81)
RDW: 12.9 % (ref 11.5–15.5)
WBC: 5.2 10*3/uL (ref 4.0–10.5)
nRBC: 0 % (ref 0.0–0.2)

## 2022-08-24 LAB — CK: Total CK: 155 U/L (ref 49–397)

## 2022-08-24 LAB — MAGNESIUM: Magnesium: 2 mg/dL (ref 1.7–2.4)

## 2022-08-24 MED ORDER — METOCLOPRAMIDE HCL 5 MG/ML IJ SOLN
10.0000 mg | Freq: Once | INTRAMUSCULAR | Status: AC
  Administered 2022-08-24: 10 mg via INTRAVENOUS
  Filled 2022-08-24: qty 2

## 2022-08-24 MED ORDER — SODIUM CHLORIDE 0.9 % IV BOLUS
1000.0000 mL | Freq: Once | INTRAVENOUS | Status: AC
  Administered 2022-08-24: 1000 mL via INTRAVENOUS

## 2022-08-24 MED ORDER — ACETAMINOPHEN 500 MG PO TABS
1000.0000 mg | ORAL_TABLET | Freq: Once | ORAL | Status: AC
  Administered 2022-08-24: 1000 mg via ORAL
  Filled 2022-08-24: qty 2

## 2022-08-24 NOTE — ED Triage Notes (Signed)
Pt arrives to ED with c/o headache, nausea, and dizziness that started this morning.

## 2022-08-24 NOTE — Discharge Instructions (Signed)
You were seen in the emergency department for your headache, dizziness and muscle cramps.  Your workup showed no signs of severe dehydration, abnormal electrolytes or abnormal muscle inflammation.  Your symptoms could be due to being out in the heat yesterday and you should make sure that you are drinking plenty of fluids and you can drink electrolyte drinks like G0 which is a Gatorade without sugar.  You can follow-up with your primary doctor in the next few days to have your symptoms rechecked.  You should return to the emergency department if you are having numbness or weakness on one side of the body compared to the other, repetitive vomiting, you pass out or if you have any other new or concerning symptoms.

## 2022-08-24 NOTE — ED Provider Notes (Signed)
[name] EMERGENCY DEPARTMENT AT [facility] Provider Note   CSN: [id number] Arrival date & time: 08/24/22  0848     History  Chief Complaint  Patient presents with   Headache    James Pratt is a 52 y.o. male.  Patient is a 52 year old male with a past medical history of diabetes presenting to the emergency department with headache and dizziness.  Patient reports that he woke up this morning with a headache and tried to go to work however when going up the ladder started to feel dizzy like he might blackout.  He states that he felt nauseous and tried to make himself vomit but reported no improvement of his symptoms.  He states that his face felt hot.  He states that his headache is posterior.  He denies any associated numbness or weakness or vision changes.  He states that he has had similar symptoms in the past due to dehydration.  He states that he was out in the heat all day yesterday and was outside this morning.  He denies any fevers.  The history is provided by the patient.  Headache      Home Medications Prior to Admission medications   Medication Sig Start Date End Date Taking? Authorizing Provider  SYNJARDY XR 12.07-998 MG TB24 Take 2 tablets by mouth daily. 06/04/21  Yes [provider]  tadalafil (CIALIS) 10 MG tablet Take 10 mg by mouth daily as needed. 30 minutes before activity, do not repeat for 36 hours 07/26/22  Yes [provider]  acetaminophen (TYLENOL) 500 MG tablet Take 1,000 mg by mouth every 6 (six) hours as needed for moderate pain.    [provider]  glipiZIDE (GLUCOTROL) 5 MG tablet Take 0.5 tablets (2.5 mg total) by mouth 2 (two) times daily before a meal. 04/26/19   [name], DO  glucose blood (ONE TOUCH ULTRA TEST) test strip Dispense QS for twice daily testing 02/22/19   [name], DO  ibuprofen (ADVIL,MOTRIN) 200 MG tablet Take 800 mg by mouth every 6 (six) hours as needed for moderate  pain.    [provider]  ipratropium (ATROVENT) 0.06 % nasal spray Place 2 sprays into both nostrils 4 (four) times daily. 04/14/17   Cathie Hoops, Amy V, PA-C  metFORMIN (GLUCOPHAGE) 500 MG tablet TAKE TWO TABLETS TWICE DAILY WITH MEALS 02/24/21   [name], MD  ondansetron (ZOFRAN) 4 MG tablet Take 1 tablet (4 mg total) by mouth every 4 (four) hours as needed for nausea or vomiting. 03/24/21   [name], DO  polyethylene glycol (MIRALAX / GLYCOLAX) 17 g packet Take 17 g by mouth 2 (two) times daily. 06/21/21   [name], [name], MD  sildenafil (VIAGRA) 25 MG tablet Take 1 tablet (25 mg total) by mouth daily as needed for erectile dysfunction. Take 1 hour prior to sexual activity. 03/19/19   [name], DO      Allergies    Ezetimibe and Meloxicam    Review of Systems   Review of Systems  Neurological:  Positive for headaches.    Physical Exam Updated Vital Signs BP 112/76   Pulse (!) 58   Temp 98.1 F (36.7 C) (Oral)   Resp 15   Ht 5\' 8"  (1.727 m)   Wt 73 kg   SpO2 100%   BMI 24.48 kg/m  Physical Exam Vitals and nursing note reviewed.  Constitutional:      General: He is not in acute  distress.    Appearance: He is well-developed.  HENT:     Head: Normocephalic and atraumatic.     Mouth/Throat:     Mouth: Mucous membranes are moist.     Pharynx: Oropharynx is clear.  Eyes:     Extraocular Movements: Extraocular movements intact.     Comments: R pupil ~30mm, L eye surgical cloudy No nystagmus  Cardiovascular:     Rate and Rhythm: Normal rate and regular rhythm.     Heart sounds: Normal heart sounds.  Pulmonary:     Effort: Pulmonary effort is normal.     Breath sounds: Normal breath sounds.  Abdominal:     Palpations: Abdomen is soft.  Musculoskeletal:        General: Normal range of motion.     Cervical back: Normal range of motion and neck supple. No rigidity.  Skin:    General: Skin is warm and dry.  Neurological:     Mental Status: He is alert  and oriented to person, place, and time.     GCS: GCS eye subscore is 4. GCS verbal subscore is 5. GCS motor subscore is 6.     Cranial Nerves: No cranial nerve deficit, dysarthria or facial asymmetry.     Sensory: No sensory deficit.     Motor: No weakness.     Coordination: Coordination normal.  Psychiatric:        Mood and Affect: Mood normal.        Behavior: Behavior normal.     ED Results / Procedures / Treatments   Labs (all labs ordered are listed, but only abnormal results are displayed) Labs Reviewed  COMPREHENSIVE METABOLIC PANEL - Abnormal; Notable for the following components:      Result Value   Sodium 133 (*)    Glucose, Bld 150 (*)    BUN 21 (*)    All other components within normal limits  CBC WITH DIFFERENTIAL/PLATELET  MAGNESIUM  CK    EKG EKG Interpretation  Date/Time:  Wednesday August 24 2022 09:40:09 EDT Ventricular Rate:  65 PR Interval:  171 QRS Duration: 96 QT Interval:  411 QTC Calculation: 428 R Axis:   91 Text Interpretation: Sinus rhythm Borderline right axis deviation ST elev, probable normal early repol pattern No significant change since last tracing Confirmed by Elayne Snare ([id number]) on 08/24/2022 9:51:26 AM  Radiology No results found.  Procedures Procedures    Medications Ordered in ED Medications  metoCLOPramide (REGLAN) injection 10 mg (10 mg Intravenous Given 08/24/22 0942)  acetaminophen (TYLENOL) tablet 1,000 mg (1,000 mg Oral Given 08/24/22 0943)  sodium chloride 0.9 % bolus 1,000 mL (0 mLs Intravenous Stopped 08/24/22 1050)    ED Course/ Medical Decision Making/ A&P Clinical Course as of 08/24/22 1114  Wed Aug 24, 2022  1040 Labs within normal range. [VK]  1112 Upon reassessment he reports that his symptoms have improved.  He is stable for discharge home with outpatient follow-up and was given strict return precautions. [VK]    Clinical Course User Index [VK] Rexford Maus, DO                              Medical Decision Making This patient presents to the ED with chief complaint(s) of headache, dizziness with pertinent past medical history of DM which further complicates the presenting complaint. The complaint involves an extensive differential diagnosis and also carries with it a high risk of complications and  morbidity.    The differential diagnosis includes patient has no focal neurologic deficits making ICH or mass effect unlikely, no fevers or meningismus making meningitis unlikely, considering dehydration, electrolyte abnormality, arrhythmia, anemia, tension headache, migraine headache  Additional history obtained: Additional history obtained from N/A Records reviewed Primary Care Documents  ED Course and Reassessment: On patient's arrival he is hemodynamically stable in no acute distress without focal neurologic deficits on exam.  He will have EKG and labs performed to evaluate for potential causes of his dizziness and will be treated with fluids, Tylenol and Reglan for symptomatic management and will be closely reassessed.  Independent labs interpretation:  Within normal range  Independent visualization of imaging: - N/A  Consultation: - Consulted or discussed management/test interpretation w/ external professional: N/A  Consideration for admission or further workup: Patient has no emergent conditions requiring admission or further work-up at this time and is stable for discharge home with primary care follow-up  Social Determinants of health: N/A    Amount and/or Complexity of Data Reviewed Labs: ordered.  Risk OTC drugs. Prescription drug management.          Final Clinical Impression(s) / ED Diagnoses Final diagnoses:  Acute nonintractable headache, unspecified headache type  Dizziness    Rx / DC Orders ED Discharge Orders     None         Rexford Maus, DO 08/24/22 1114

## 2022-11-15 ENCOUNTER — Encounter (HOSPITAL_BASED_OUTPATIENT_CLINIC_OR_DEPARTMENT_OTHER): Payer: Self-pay | Admitting: Emergency Medicine

## 2022-11-15 ENCOUNTER — Emergency Department (HOSPITAL_BASED_OUTPATIENT_CLINIC_OR_DEPARTMENT_OTHER)
Admission: EM | Admit: 2022-11-15 | Discharge: 2022-11-15 | Disposition: A | Discharge status: Home / Self Care | Payer: 59 | Attending: Emergency Medicine | Admitting: Emergency Medicine

## 2022-11-15 ENCOUNTER — Other Ambulatory Visit: Payer: Self-pay

## 2022-11-15 ENCOUNTER — Emergency Department (HOSPITAL_BASED_OUTPATIENT_CLINIC_OR_DEPARTMENT_OTHER): Payer: 59 | Admitting: Radiology

## 2022-11-15 DIAGNOSIS — M791 Myalgia, unspecified site: Secondary | ICD-10-CM | POA: Diagnosis not present

## 2022-11-15 DIAGNOSIS — M25512 Pain in left shoulder: Secondary | ICD-10-CM | POA: Diagnosis present

## 2022-11-15 MED ORDER — KETOROLAC TROMETHAMINE 30 MG/ML IJ SOLN
30.0000 mg | Freq: Once | INTRAMUSCULAR | Status: AC
Start: 2022-11-15 — End: 2022-11-15
  Administered 2022-11-15: 30 mg via INTRAMUSCULAR
  Filled 2022-11-15: qty 1

## 2022-11-15 MED ORDER — METHOCARBAMOL 500 MG PO TABS: 500.0000 mg | ORAL_TABLET | Freq: Two times a day (BID) | ORAL | 0 refills | Status: DC

## 2022-11-15 NOTE — ED Triage Notes (Signed)
Pt arrives to ED with c/o left sided shoulder pain that has been on-going for 1 month. He denies any injury to the shoulder. Was seen by PCP yesterday and dx with bursitis, prescribed celebrex 200mg .

## 2022-11-15 NOTE — Discharge Instructions (Signed)
Your examination today is most concerning for a muscular injury ?1. Medications: alternate ibuprofen and tylenol for pain control, take all usual home medications as they are prescribed ?2. Treatment: rest, ice, elevate and use an ACE wrap or other compressive therapy to decrease swelling. Also drink plenty of fluids and do plenty of gentle stretching and move the affected muscle through its normal range of motion to prevent stiffness. ?3. Follow Up: If your symptoms do not improve please follow up with orthopedics/sports medicine or your PCP for discussion of your diagnoses and further evaluation after today's visit; if you do not have a primary care doctor use the resource guide provided to find one; Please return to the ER for worsening symptoms or other concerns.  ? ? ?Please use Tylenol or ibuprofen for pain.  You may use 600 mg ibuprofen every 6 hours or 1000 mg of Tylenol every 6 hours.  You may choose to alternate between the 2.  This would be most effective.  Not to exceed 4 g of Tylenol within 24 hours.  Not to exceed 3200 mg ibuprofen 24 hours.   ?

## 2022-11-15 NOTE — ED Provider Notes (Signed)
EMERGENCY DEPARTMENT AT [facility] Provider Note   CSN: [id number] Arrival date & time: 11/15/22  1232     History {Add pertinent medical, surgical, social history, OB history to HPI:1} Chief Complaint  Patient presents with   Shoulder Pain    James Pratt is a 52 y.o. male.   Shoulder Pain      Home Medications Prior to Admission medications   Medication Sig Start Date End Date Taking? Authorizing Provider  methocarbamol (ROBAXIN) 500 MG tablet Take 1 tablet (500 mg total) by mouth 2 (two) times daily. 11/15/22  Yes [name], [name], PA  acetaminophen (TYLENOL) 500 MG tablet Take 1,000 mg by mouth every 6 (six) hours as needed for moderate pain.    [provider]  glipiZIDE (GLUCOTROL) 5 MG tablet Take 0.5 tablets (2.5 mg total) by mouth 2 (two) times daily before a meal. 04/26/19   [name], DO  glucose blood (ONE TOUCH ULTRA TEST) test strip Dispense QS for twice daily testing 02/22/19   [name], DO  ibuprofen (ADVIL,MOTRIN) 200 MG tablet Take 800 mg by mouth every 6 (six) hours as needed for moderate pain.    [provider]  ipratropium (ATROVENT) 0.06 % nasal spray Place 2 sprays into both nostrils 4 (four) times daily. 04/14/17   Cathie Hoops, Amy V, PA-C  metFORMIN (GLUCOPHAGE) 500 MG tablet TAKE TWO TABLETS TWICE DAILY WITH MEALS 02/24/21   [name], MD  ondansetron (ZOFRAN) 4 MG tablet Take 1 tablet (4 mg total) by mouth every 4 (four) hours as needed for nausea or vomiting. 03/24/21   [name], DO  polyethylene glycol (MIRALAX / GLYCOLAX) 17 g packet Take 17 g by mouth 2 (two) times daily. 06/21/21   [name], [name], MD  sildenafil (VIAGRA) 25 MG tablet Take 1 tablet (25 mg total) by mouth daily as needed for erectile dysfunction. Take 1 hour prior to sexual activity. 03/19/19   [name], DO  SYNJARDY XR 12.07-998 MG TB24 Take 2 tablets by mouth daily. 06/04/21   [provider]   tadalafil (CIALIS) 10 MG tablet Take 10 mg by mouth daily as needed. 30 minutes before activity, do not repeat for 36 hours 07/26/22   [provider]      Allergies    Ezetimibe and Meloxicam    Review of Systems   Review of Systems  Physical Exam Updated Vital Signs BP 128/78   Pulse 74   Temp 98.2 F (36.8 C)   Resp 17   SpO2 100%  Physical Exam  ED Results / Procedures / Treatments   Labs (all labs ordered are listed, but only abnormal results are displayed) Labs Reviewed - No data to display  EKG None  Radiology DG Shoulder Left  Result Date: 11/15/2022 CLINICAL DATA:  Pain for a month EXAM: LEFT SHOULDER - 3 VIEW COMPARISON:  None Available. FINDINGS: There is no evidence of fracture or dislocation. There is no evidence of arthropathy or other focal bone abnormality. Soft tissues are unremarkable. IMPRESSION: Negative. Electronically Signed   By: [name] M.D.   On: 11/15/2022 13:25    Procedures Procedures  {Document cardiac monitor, telemetry assessment procedure when appropriate:1}  Medications Ordered in ED Medications  ketorolac (TORADOL) 30 MG/ML injection 30 mg (30 mg Intramuscular Given 11/15/22 1434)    ED Course/ Medical Decision Making/ A&P   {   Click here for ABCD2, HEART and other calculatorsREFRESH Note before  signing :1}                              Medical Decision Making Amount and/or Complexity of Data Reviewed Radiology: ordered.  Risk Prescription drug management.   ***  {Document critical care time when appropriate:1} {Document review of labs and clinical decision tools ie heart score, Chads2Vasc2 etc:1}  {Document your independent review of radiology images, and any outside records:1} {Document your discussion with family members, caretakers, and with consultants:1} {Document social determinants of health affecting pt's care:1} {Document your decision making why or why not admission, treatments were  needed:1} Final Clinical Impression(s) / ED Diagnoses Final diagnoses:  Muscle pain    Rx / DC Orders ED Discharge Orders          Ordered    methocarbamol (ROBAXIN) 500 MG tablet  2 times daily        11/15/22 1427

## 2022-11-15 NOTE — ED Notes (Signed)
 RN reviewed discharge instructions with pt. Pt verbalized understanding and had no further questions. VSS upon discharge.  

## 2023-01-24 IMAGING — DX DG CHEST 2V
2 series · 2 of 2 positions shown · non-contrast
Comparison: 02/23/2019

CLINICAL DATA: Productive cough, chest tightness

EXAM:
CHEST - 2 VIEW

[chest lat]
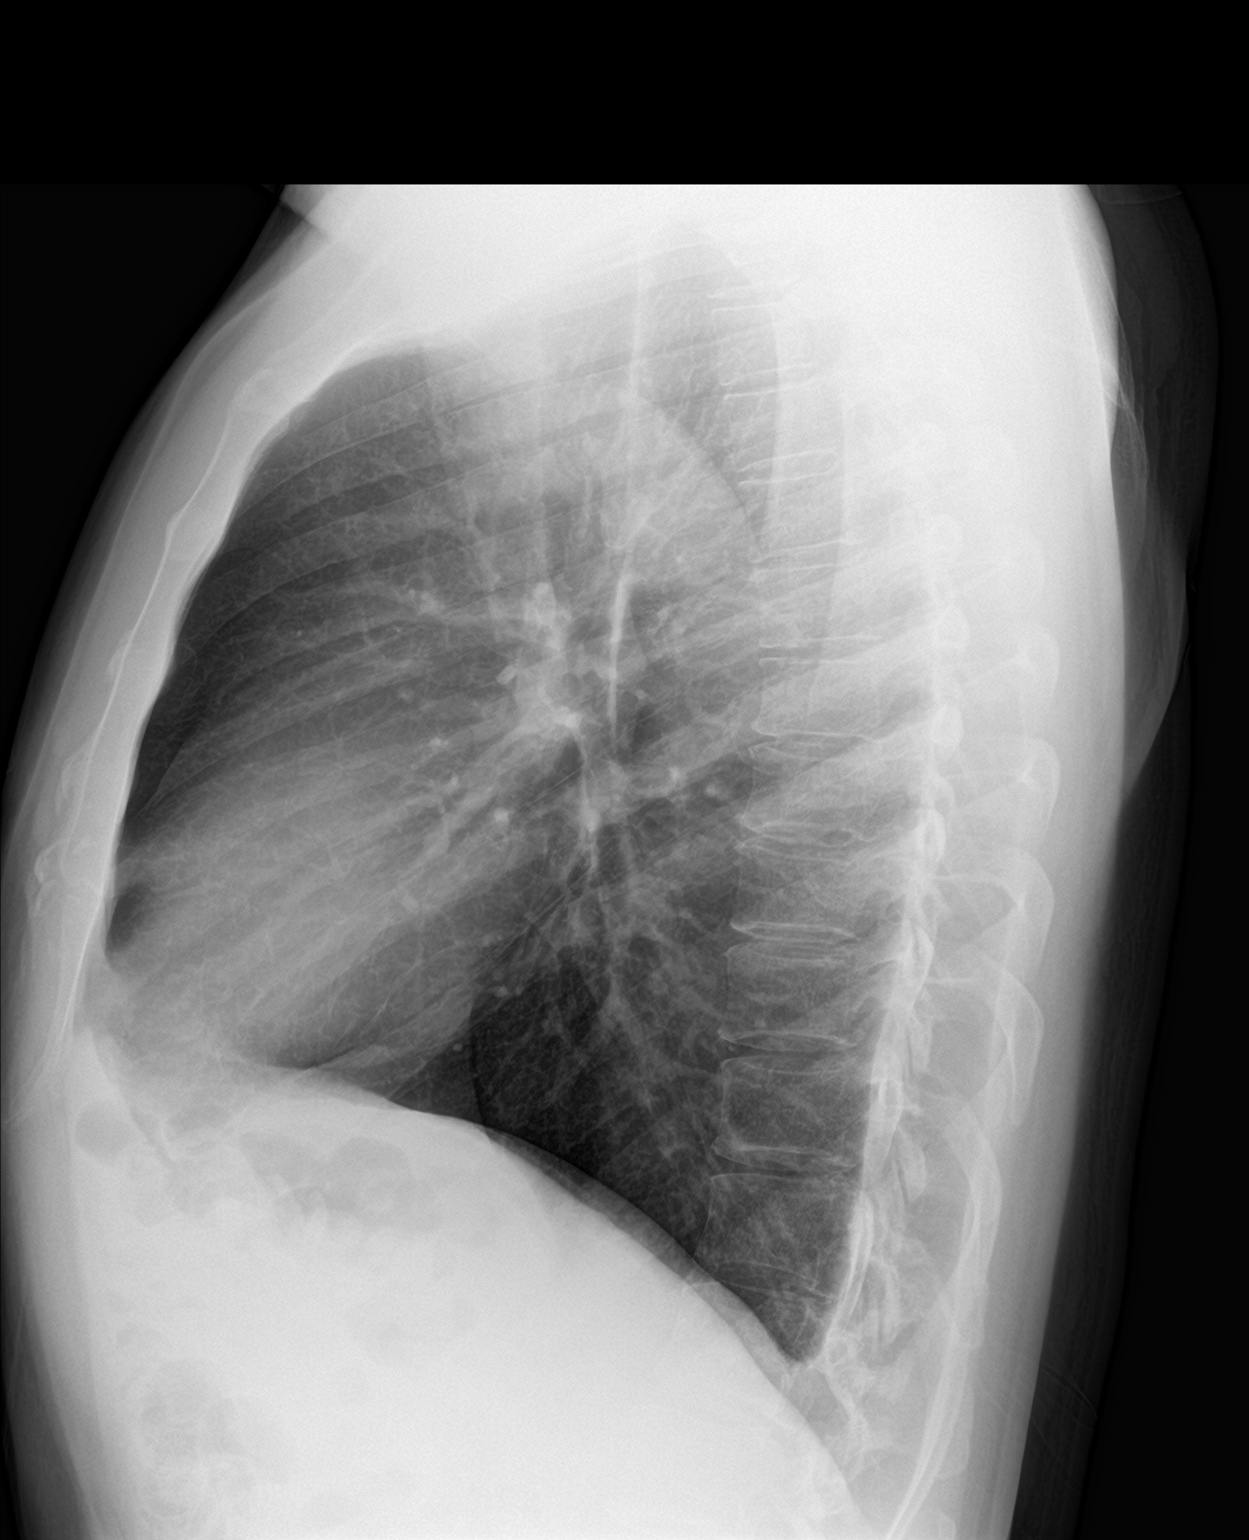

[chest pa]
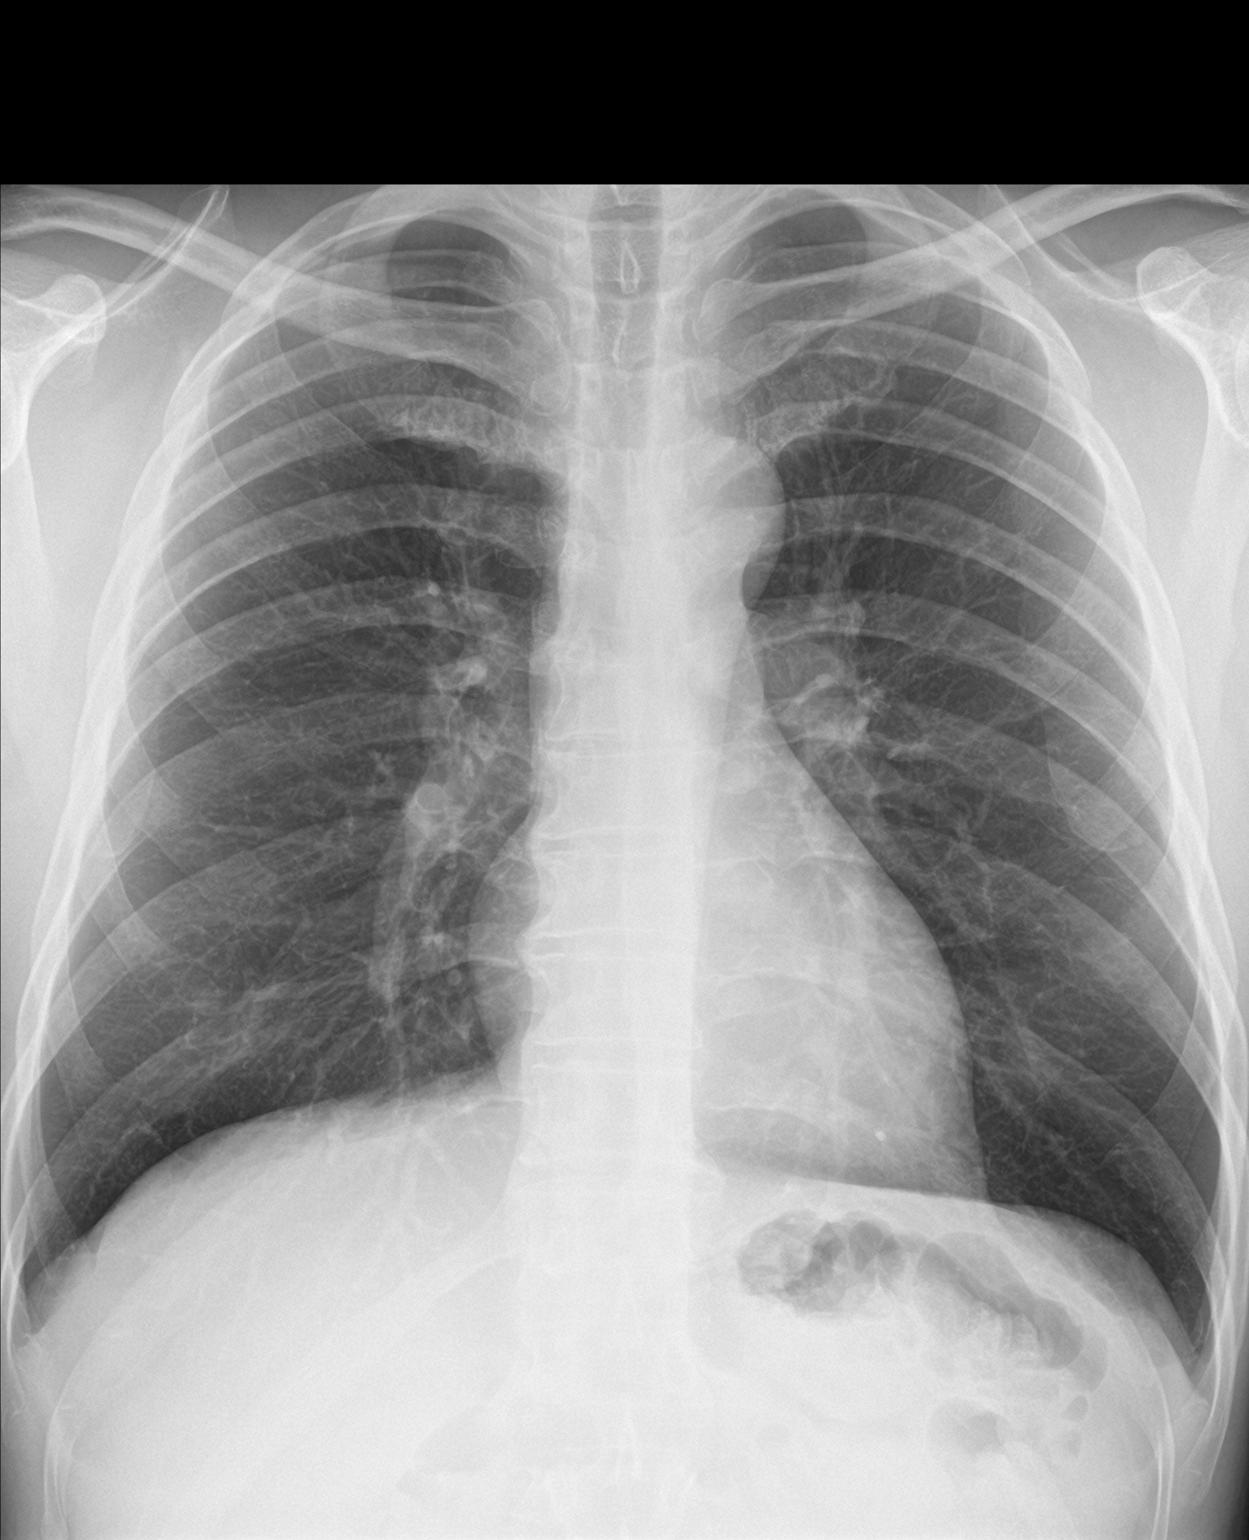

[2 of 2 positions shown; findings below may reference images not displayed]

FINDINGS: The heart size and mediastinal contours are within normal limits.
Both lungs are clear. The visualized skeletal structures are
unremarkable.
IMPRESSION: No active cardiopulmonary disease.

## 2023-04-08 ENCOUNTER — Emergency Department (HOSPITAL_BASED_OUTPATIENT_CLINIC_OR_DEPARTMENT_OTHER)
Admission: EM | Admit: 2023-04-08 | Discharge: 2023-04-08 | Disposition: A | Discharge status: Home / Self Care | Payer: Commercial Managed Care - HMO | Attending: Emergency Medicine | Admitting: Emergency Medicine

## 2023-04-08 ENCOUNTER — Encounter (HOSPITAL_BASED_OUTPATIENT_CLINIC_OR_DEPARTMENT_OTHER): Payer: Self-pay

## 2023-04-08 ENCOUNTER — Other Ambulatory Visit: Payer: Self-pay

## 2023-04-08 ENCOUNTER — Emergency Department (HOSPITAL_BASED_OUTPATIENT_CLINIC_OR_DEPARTMENT_OTHER): Payer: Commercial Managed Care - HMO

## 2023-04-08 DIAGNOSIS — E119 Type 2 diabetes mellitus without complications: Secondary | ICD-10-CM | POA: Insufficient documentation

## 2023-04-08 DIAGNOSIS — Z20822 Contact with and (suspected) exposure to covid-19: Secondary | ICD-10-CM | POA: Insufficient documentation

## 2023-04-08 DIAGNOSIS — R1013 Epigastric pain: Secondary | ICD-10-CM | POA: Diagnosis present

## 2023-04-08 DIAGNOSIS — Z7984 Long term (current) use of oral hypoglycemic drugs: Secondary | ICD-10-CM | POA: Insufficient documentation

## 2023-04-08 LAB — CBC WITH DIFFERENTIAL/PLATELET
Abs Immature Granulocytes: 0.01 10*3/uL (ref 0.00–0.07)
Basophils Absolute: 0 10*3/uL (ref 0.0–0.1)
Basophils Relative: 1 %
Eosinophils Absolute: 0.1 10*3/uL (ref 0.0–0.5)
Eosinophils Relative: 2 %
HCT: 42.9 % (ref 39.0–52.0)
Hemoglobin: 15 g/dL (ref 13.0–17.0)
Immature Granulocytes: 0 %
Lymphocytes Relative: 33 %
Lymphs Abs: 1.4 10*3/uL (ref 0.7–4.0)
MCH: 30.3 pg (ref 26.0–34.0)
MCHC: 35 g/dL (ref 30.0–36.0)
MCV: 86.7 fL (ref 80.0–100.0)
Monocytes Absolute: 0.3 10*3/uL (ref 0.1–1.0)
Monocytes Relative: 7 %
Neutro Abs: 2.4 10*3/uL (ref 1.7–7.7)
Neutrophils Relative %: 57 %
Platelets: 145 10*3/uL — ABNORMAL LOW (ref 150–400)
RBC: 4.95 MIL/uL (ref 4.22–5.81)
RDW: 12.2 % (ref 11.5–15.5)
WBC: 4.1 10*3/uL (ref 4.0–10.5)
nRBC: 0 % (ref 0.0–0.2)

## 2023-04-08 LAB — COMPREHENSIVE METABOLIC PANEL
ALT: 12 U/L (ref 0–44)
AST: 19 U/L (ref 15–41)
Albumin: 4.4 g/dL (ref 3.5–5.0)
Alkaline Phosphatase: 61 U/L (ref 38–126)
Anion gap: 8 (ref 5–15)
BUN: 15 mg/dL (ref 6–20)
CO2: 25 mmol/L (ref 22–32)
Calcium: 9.6 mg/dL (ref 8.9–10.3)
Chloride: 105 mmol/L (ref 98–111)
Creatinine, Ser: 0.72 mg/dL (ref 0.61–1.24)
GFR, Estimated: >60 mL/min (ref >60)
Glucose, Bld: 127 mg/dL — ABNORMAL HIGH (ref 70–99)
Potassium: 4 mmol/L (ref 3.5–5.1)
Sodium: 138 mmol/L (ref 135–145)
Total Bilirubin: 0.6 mg/dL (ref 0.0–1.2)
Total Protein: 7.4 g/dL (ref 6.5–8.1)

## 2023-04-08 LAB — RESP PANEL BY RT-PCR (RSV, FLU A&B, COVID)  RVPGX2
Influenza A by PCR: NEGATIVE
Influenza B by PCR: NEGATIVE
Resp Syncytial Virus by PCR: NEGATIVE
SARS Coronavirus 2 by RT PCR: NEGATIVE

## 2023-04-08 LAB — TROPONIN I (HIGH SENSITIVITY): Troponin I (High Sensitivity): <2 ng/L (ref <18)

## 2023-04-08 LAB — LIPASE, BLOOD: Lipase: 18 U/L (ref 11–51)

## 2023-04-08 MED ORDER — IOHEXOL 300 MG/ML  SOLN
100.0000 mL | Freq: Once | INTRAMUSCULAR | Status: AC | PRN
  Administered 2023-04-08: 75 mL via INTRAVENOUS

## 2023-04-08 MED ORDER — DOCUSATE SODIUM 100 MG PO CAPS: 100.0000 mg | ORAL_CAPSULE | Freq: Two times a day (BID) | ORAL | 0 refills | Status: DC

## 2023-04-08 MED ORDER — DICYCLOMINE HCL 20 MG PO TABS: 20.0000 mg | ORAL_TABLET | Freq: Three times a day (TID) | ORAL | 0 refills | Status: DC

## 2023-04-08 MED ORDER — PANTOPRAZOLE SODIUM 20 MG PO TBEC: 20.0000 mg | DELAYED_RELEASE_TABLET | Freq: Every day | ORAL | 0 refills | Status: DC

## 2023-04-08 NOTE — ED Provider Notes (Signed)
[place] EMERGENCY DEPARTMENT AT [facility] Provider Note   CSN: [id number] Arrival date & time: 04/08/23  4098     History  Chief Complaint  Patient presents with   Chills   Constipation    James Pratt is a 53 y.o. male.  Patient is a 53 year old male with a past medical history of diabetes and hyperlipidemia who presents to the emergency department with a chief complaint of chills, rhinorrhea, epigastric domino pain, nausea which has been ongoing for approximate the past 5 days.  Patient notes that he initially had vomiting and diarrhea during onset of symptoms but notes that he has not moved his bowels for approximate the past 4 days.  He denies any associated fevers.  He does note that the pain intermittently radiates towards his chest.  He denies any changes in urination to include dysuria or hematuria.  Patient notes his home blood sugars have been stable.  He denies any recent falls or blunt abdominal wall trauma.  He denies any melena, hematochezia, hematemesis.  He denies any cough, sore throat.   Constipation Associated symptoms: nausea        Home Medications Prior to Admission medications   Medication Sig Start Date End Date Taking? Authorizing Provider  acetaminophen (TYLENOL) 500 MG tablet Take 1,000 mg by mouth every 6 (six) hours as needed for moderate pain.    [provider]  glipiZIDE (GLUCOTROL) 5 MG tablet Take 0.5 tablets (2.5 mg total) by mouth 2 (two) times daily before a meal. 04/26/19   [name], DO  glucose blood (ONE TOUCH ULTRA TEST) test strip Dispense QS for twice daily testing 02/22/19   [name], DO  ibuprofen (ADVIL,MOTRIN) 200 MG tablet Take 800 mg by mouth every 6 (six) hours as needed for moderate pain.    [provider]  ipratropium (ATROVENT) 0.06 % nasal spray Place 2 sprays into both nostrils 4 (four) times daily. 04/14/17   Cathie Hoops, Amy V, PA-C  metFORMIN (GLUCOPHAGE) 500 MG tablet TAKE TWO  TABLETS TWICE DAILY WITH MEALS 02/24/21   [name], MD  methocarbamol (ROBAXIN) 500 MG tablet Take 1 tablet (500 mg total) by mouth 2 (two) times daily. 11/15/22   Fondaw, Rodrigo Ran, PA  ondansetron (ZOFRAN) 4 MG tablet Take 1 tablet (4 mg total) by mouth every 4 (four) hours as needed for nausea or vomiting. 03/24/21   [name], DO  polyethylene glycol (MIRALAX / GLYCOLAX) 17 g packet Take 17 g by mouth 2 (two) times daily. 06/21/21   [name], [name], MD  sildenafil (VIAGRA) 25 MG tablet Take 1 tablet (25 mg total) by mouth daily as needed for erectile dysfunction. Take 1 hour prior to sexual activity. 03/19/19   [name], DO  SYNJARDY XR 12.07-998 MG TB24 Take 2 tablets by mouth daily. 06/04/21   [provider]  tadalafil (CIALIS) 10 MG tablet Take 10 mg by mouth daily as needed. 30 minutes before activity, do not repeat for 36 hours 07/26/22   [provider]      Allergies    Ezetimibe and Meloxicam    Review of Systems   Review of Systems  HENT:         Rhinorrhea  Gastrointestinal:  Positive for constipation and nausea.  All other systems reviewed and are negative.   Physical Exam Updated Vital Signs BP (!) 135/95 (BP Location: Left Arm)   Pulse 77   Temp 98 F (36.7 C) (  Temporal)   Resp 20   Ht 5\' 8"  (1.727 m)   Wt 70.3 kg   SpO2 100%   BMI 23.57 kg/m  Physical Exam Constitutional:      Appearance: Normal appearance.  HENT:     Head: Normocephalic and atraumatic.     Nose: Nose normal.     Mouth/Throat:     Mouth: Mucous membranes are moist.  Eyes:     Extraocular Movements: Extraocular movements intact.     Conjunctiva/sclera: Conjunctivae normal.     Pupils: Pupils are equal, round, and reactive to light.  Cardiovascular:     Rate and Rhythm: Normal rate and regular rhythm.     Pulses: Normal pulses.     Heart sounds: Normal heart sounds. No murmur heard. Pulmonary:     Effort: Pulmonary effort is normal. No respiratory  distress.     Breath sounds: Normal breath sounds. No stridor. No wheezing, rhonchi or rales.  Abdominal:     General: Abdomen is flat. Bowel sounds are normal.     Palpations: Abdomen is soft.     Comments: Tenderness palpation to the epigastric region and left lower quadrant  Musculoskeletal:        General: No swelling or tenderness. Normal range of motion.     Cervical back: Normal range of motion and neck supple.  Skin:    General: Skin is warm and dry.  Neurological:     General: No focal deficit present.     Mental Status: He is alert and oriented to person, place, and time. Mental status is at baseline.  Psychiatric:        Mood and Affect: Mood normal.        Behavior: Behavior normal.        Thought Content: Thought content normal.        Judgment: Judgment normal.     ED Results / Procedures / Treatments   Labs (all labs ordered are listed, but only abnormal results are displayed) Labs Reviewed  RESP PANEL BY RT-PCR (RSV, FLU A&B, COVID)  RVPGX2  CBC WITH DIFFERENTIAL/PLATELET  COMPREHENSIVE METABOLIC PANEL  LIPASE, BLOOD  TROPONIN I (HIGH SENSITIVITY)    EKG None  Radiology No results found.  Procedures Procedures    Medications Ordered in ED Medications - No data to display  ED Course/ Medical Decision Making/ A&P                                 Medical Decision Making Patient is doing very well at this time and is stable for discharge home.  Discussed with patient that workup in the emergency department has been overall unremarkable.  Patient had no associated leukocytosis or anemia.  He had no changes in kidney function or liver function.  Electrolytes were within normal limits.  EKG had no acute ischemic changes and negative troponin.  Do not suspect ACS at this point.  Symptoms are not exertional or positional in nature.  CT scan of the abdomen pelvis demonstrated no signs of acute surgical pathology and this was reviewed by myself as well.  He has  no indication of acute appendicitis, cholecystitis, bowel obstruction, diverticulitis, pyelonephritis, kidney stone, pancreatitis, mesenteric ischemia.  Discussed with patient that we will treat him symptomatically at this time and recommend close follow-up with primary care doctor on an outpatient basis.  Strict return precautions were discussed for any new or worsening symptoms.  Patient voiced understanding to the plan and had no additional questions.  Amount and/or Complexity of Data Reviewed Labs: ordered. Radiology: ordered.  Risk OTC drugs. Prescription drug management.           Final Clinical Impression(s) / ED Diagnoses Final diagnoses:  None    Rx / DC Orders ED Discharge Orders     None         Lelon Perla, PA-C 04/08/23 1319    [name], Clabe Seal, DO 04/09/23 [phone]

## 2023-04-08 NOTE — ED Triage Notes (Signed)
Patient arrives ambulatory to the ED with complaints of chills and cold sweats x3 days. Also reports some constipation as well.

## 2023-04-08 NOTE — Discharge Instructions (Addendum)
 Please follow-up closely with your primary care doctor on an outpatient basis.  Return to emergency department immediately for any new or worsening symptoms.

## 2023-09-21 ENCOUNTER — Encounter (HOSPITAL_BASED_OUTPATIENT_CLINIC_OR_DEPARTMENT_OTHER): Payer: Self-pay

## 2023-09-21 ENCOUNTER — Emergency Department (HOSPITAL_BASED_OUTPATIENT_CLINIC_OR_DEPARTMENT_OTHER)
Admission: EM | Admit: 2023-09-21 | Discharge: 2023-09-21 | Disposition: A | Discharge status: Home / Self Care | Attending: Emergency Medicine | Admitting: Emergency Medicine

## 2023-09-21 ENCOUNTER — Other Ambulatory Visit: Payer: Self-pay

## 2023-09-21 DIAGNOSIS — R0981 Nasal congestion: Secondary | ICD-10-CM

## 2023-09-21 DIAGNOSIS — Z7984 Long term (current) use of oral hypoglycemic drugs: Secondary | ICD-10-CM | POA: Diagnosis not present

## 2023-09-21 DIAGNOSIS — J029 Acute pharyngitis, unspecified: Secondary | ICD-10-CM | POA: Diagnosis not present

## 2023-09-21 DIAGNOSIS — E119 Type 2 diabetes mellitus without complications: Secondary | ICD-10-CM | POA: Diagnosis not present

## 2023-09-21 DIAGNOSIS — R059 Cough, unspecified: Secondary | ICD-10-CM | POA: Diagnosis present

## 2023-09-21 DIAGNOSIS — R051 Acute cough: Secondary | ICD-10-CM

## 2023-09-21 LAB — RESP PANEL BY RT-PCR (RSV, FLU A&B, COVID)  RVPGX2
Influenza A by PCR: NEGATIVE
Influenza B by PCR: NEGATIVE
Resp Syncytial Virus by PCR: NEGATIVE
SARS Coronavirus 2 by RT PCR: NEGATIVE

## 2023-09-21 LAB — GROUP A STREP BY PCR: Group A Strep by PCR: NOT DETECTED

## 2023-09-21 MED ORDER — TRIAMCINOLONE ACETONIDE 55 MCG/ACT NA AERO: 2.0000 | INHALATION_SPRAY | Freq: Every day | NASAL | 0 refills | Status: DC

## 2023-09-21 MED ORDER — BENZONATATE 100 MG PO CAPS: 100.0000 mg | ORAL_CAPSULE | Freq: Three times a day (TID) | ORAL | 0 refills | Status: DC | PRN

## 2023-09-21 MED ORDER — CETIRIZINE-PSEUDOEPHEDRINE ER 5-120 MG PO TB12: 1.0000 | ORAL_TABLET | Freq: Every day | ORAL | 0 refills | Status: DC | PRN

## 2023-09-21 NOTE — ED Triage Notes (Signed)
 Pt POV from home c/o sore throat x 2 days and cough x 1 day. Unknown fevers at home. Denies CP/SOB, NVD. NAD during triage

## 2023-09-21 NOTE — Discharge Instructions (Signed)
 As discussed, you tested negative for COVID, flu, RSV as well as strep throat.  I suspect your symptoms are likely secondary to viral illness.  Will send you home with a couple different medications to treat your symptoms including a nasal spray, allergy medicine with decongestant, cough suppressant.  Recommend follow-up with your primary care for reassessment.

## 2023-09-21 NOTE — ED Provider Notes (Signed)
 [place] EMERGENCY DEPARTMENT AT [facility] Provider Note   CSN: [id number] Arrival date & time: 09/21/23  1031     Patient presents with: Sore Throat   James Pratt is a 53 y.o. male.    Sore Throat   54 year old male presents emergency department with complaints of sore throat, nasal congestion, cough.  Patient states that he began with symptoms 2 to 3 days ago.  He denies any known sick contact but does report to visiting his mother at [facility] as she has been admitted there.  Has been trying NyQuil/DayQuil last night into this morning which has helped temporarily.  Denies any fevers, chills, difficulty breathing, abdominal pain, nausea, vomiting.  Presents emergency department for further assessment/evaluation.  Past medical history significant for orthostatic hypotension, diabetes mellitus, GERD, hyperlipidemia, polysubstance use  Prior to Admission medications   Medication Sig Start Date End Date Taking? Authorizing Provider  acetaminophen  (TYLENOL ) 500 MG tablet Take 1,000 mg by mouth every 6 (six) hours as needed for moderate pain.    [provider]  dicyclomine  (BENTYL ) 20 MG tablet Take 1 tablet (20 mg total) by mouth 4 (four) times daily -  before meals and at bedtime. 04/08/23   Daralene Lonni BIRCH, PA-C  docusate sodium  (COLACE) 100 MG capsule Take 1 capsule (100 mg total) by mouth every 12 (twelve) hours. 04/08/23   Daralene Lonni BIRCH, PA-C  glipiZIDE  (GLUCOTROL ) 5 MG tablet Take 0.5 tablets (2.5 mg total) by mouth 2 (two) times daily before a meal. 04/26/19   Jarrett Lucie SAILOR, DO  glucose blood (ONE TOUCH ULTRA TEST) test strip Dispense QS for twice daily testing 02/22/19   Jarrett Lucie SAILOR, DO  ibuprofen  (ADVIL ,MOTRIN ) 200 MG tablet Take 800 mg by mouth every 6 (six) hours as needed for moderate pain.    [provider]  ipratropium (ATROVENT ) 0.06 % nasal spray Place 2 sprays into both nostrils 4 (four) times daily.  04/14/17   Babara, Amy V, PA-C  metFORMIN  (GLUCOPHAGE ) 500 MG tablet TAKE TWO TABLETS TWICE DAILY WITH MEALS 02/24/21   Marlee Lynwood NOVAK, MD  methocarbamol  (ROBAXIN ) 500 MG tablet Take 1 tablet (500 mg total) by mouth 2 (two) times daily. 11/15/22   Neldon Hamp RAMAN, PA  ondansetron  (ZOFRAN ) 4 MG tablet Take 1 tablet (4 mg total) by mouth every 4 (four) hours as needed for nausea or vomiting. 03/24/21   Elnor Jayson LABOR, DO  pantoprazole  (PROTONIX ) 20 MG tablet Take 1 tablet (20 mg total) by mouth daily for 14 days. 04/08/23 04/22/23  Daralene Lonni D, PA-C  polyethylene glycol (MIRALAX  / GLYCOLAX ) 17 g packet Take 17 g by mouth 2 (two) times daily. 06/21/21   [name], Charmaine FALCON, MD  sildenafil  (VIAGRA ) 25 MG tablet Take 1 tablet (25 mg total) by mouth daily as needed for erectile dysfunction. Take 1 hour prior to sexual activity. 03/19/19   Jarrett Lucie SAILOR, DO  SYNJARDY XR 12.07-998 MG TB24 Take 2 tablets by mouth daily. 06/04/21   [provider]  tadalafil (CIALIS) 10 MG tablet Take 10 mg by mouth daily as needed. 30 minutes before activity, do not repeat for 36 hours 07/26/22   [provider]    Allergies: Ezetimibe and Meloxicam     Review of Systems  All other systems reviewed and are negative.   Updated Vital Signs BP 134/78   Pulse 79   Temp 98.6 F (37 C)   Resp 20   Ht 5' 8 (1.727  m)   Wt 74.8 kg   SpO2 99%   BMI 25.09 kg/m   Physical Exam Vitals and nursing note reviewed.  Constitutional:      General: He is not in acute distress.    Appearance: He is well-developed.  HENT:     Head: Normocephalic and atraumatic.     Nose: Congestion and rhinorrhea present.     Mouth/Throat:     Comments: Mild posterior pharynx erythema.  Uvula midline with symmetrical phonation.  No sublingual extremities or swelling.  No trismus.  Tonsils 1+ bilateral without exudate. Eyes:     Conjunctiva/sclera: Conjunctivae normal.  Cardiovascular:     Rate and Rhythm: Normal rate  and regular rhythm.     Heart sounds: No murmur heard. Pulmonary:     Effort: Pulmonary effort is normal. No respiratory distress.     Breath sounds: Normal breath sounds. No wheezing, rhonchi or rales.  Abdominal:     Palpations: Abdomen is soft.     Tenderness: There is no abdominal tenderness. There is no guarding.  Musculoskeletal:        General: No swelling.     Cervical back: Neck supple.  Skin:    General: Skin is warm and dry.     Capillary Refill: Capillary refill takes less than 2 seconds.  Neurological:     Mental Status: He is alert.  Psychiatric:        Mood and Affect: Mood normal.     (all labs ordered are listed, but only abnormal results are displayed) Labs Reviewed  GROUP A STREP BY PCR  RESP PANEL BY RT-PCR (RSV, FLU A&B, COVID)  RVPGX2    EKG: None  Radiology: No results found.   Procedures   Medications Ordered in the ED - No data to display                                  Medical Decision Making Risk OTC drugs. Prescription drug management.   This patient presents to the ED for concern of sore throat, nasal congestion, cough, this involves an extensive number of treatment options, and is a complaint that carries with it a high risk of complications and morbidity.  The differential diagnosis includes COVID, flu, RSV, viral URI, pneumonia, PTA, Ludwig angina, Mnire's disease, retropharyngeal abscess, pharyngitis, other  Co morbidities that complicate the patient evaluation  See HPI   Additional history obtained:  Additional history obtained from EMR External records from outside source obtained and reviewed including hospital records   Lab Tests:  I Ordered, and personally interpreted labs.  The pertinent results include: Group A strep negative.  Viral testing negative.   Imaging Studies ordered:  N/a   Cardiac Monitoring: / EKG:  N/a   Consultations Obtained:  N/a   Problem List / ED Course / Critical  interventions / Medication management  Sore throat, nasal congestion, cough Reevaluation of the patient showed that the patient stayed the same I have reviewed the patients home medicines and have made adjustments as needed   Social Determinants of Health:  Former cigarette use.  Denies illicit drug use.   Test / Admission - Considered:  Sore throat, nasal congestion, cough Vitals signs within normal range and stable throughout visit. Laboratory/imaging studies significant for: See above 53 year old male presents emergency department with complaints of sore throat, nasal congestion, cough.  Patient states that he began with symptoms 2 to 3  days ago.  He denies any known sick contact but does report to visiting his mother at [facility] as she has been admitted there.  Has been trying NyQuil/DayQuil last night into this morning which has helped temporarily.  Denies any fevers, chills, difficulty breathing, abdominal pain, nausea, vomiting.  Presents emergency department for further assessment/evaluation. On exam, lungs clear to auscultation bilaterally.  Nasal congestion/rhinorrhea present as well as mild posterior pharyngeal erythema.  Low clinical suspicion for pneumonia.  Low clinical suspicion for Ludwig angina, PTA, retropharyngeal abscess.  Group A strep testing as well as viral testing performed by triage staff which were negative.  Suspect viral URI as cause of symptoms.  Recommend symptomatic therapy as described in AVS with follow-up with PCP in the outpatient setting for reassessment.  Treatment plan discussed with patient and he acknowledged understanding was agreeable to said plan.  Patient overall well-appearing, afebrile in no acute distress. Worrisome signs and symptoms were discussed with the patient, and the patient acknowledged understanding to return to the ED if noticed. Patient was stable upon discharge.       Final diagnoses:  None    ED Discharge Orders      None          Silver Wonda LABOR, [place] 09/21/23 1445    Geraldene Hamilton, MD 09/22/23 [phone]

## 2024-01-01 ENCOUNTER — Emergency Department (HOSPITAL_BASED_OUTPATIENT_CLINIC_OR_DEPARTMENT_OTHER)

## 2024-01-01 ENCOUNTER — Other Ambulatory Visit: Payer: Self-pay

## 2024-01-01 ENCOUNTER — Encounter (HOSPITAL_BASED_OUTPATIENT_CLINIC_OR_DEPARTMENT_OTHER): Payer: Self-pay

## 2024-01-01 ENCOUNTER — Observation Stay (HOSPITAL_BASED_OUTPATIENT_CLINIC_OR_DEPARTMENT_OTHER)
Admission: EM | Admit: 2024-01-01 | Discharge: 2024-01-03 | Disposition: A | Discharge status: Home / Self Care | Attending: Gastroenterology | Admitting: Gastroenterology

## 2024-01-01 DIAGNOSIS — F1010 Alcohol abuse, uncomplicated: Secondary | ICD-10-CM | POA: Diagnosis not present

## 2024-01-01 DIAGNOSIS — F419 Anxiety disorder, unspecified: Secondary | ICD-10-CM | POA: Insufficient documentation

## 2024-01-01 DIAGNOSIS — R933 Abnormal findings on diagnostic imaging of other parts of digestive tract: Secondary | ICD-10-CM | POA: Insufficient documentation

## 2024-01-01 DIAGNOSIS — R531 Weakness: Secondary | ICD-10-CM | POA: Diagnosis not present

## 2024-01-01 DIAGNOSIS — K922 Gastrointestinal hemorrhage, unspecified: Secondary | ICD-10-CM | POA: Diagnosis present

## 2024-01-01 DIAGNOSIS — K920 Hematemesis: Secondary | ICD-10-CM | POA: Diagnosis not present

## 2024-01-01 DIAGNOSIS — F32A Depression, unspecified: Secondary | ICD-10-CM | POA: Insufficient documentation

## 2024-01-01 DIAGNOSIS — E785 Hyperlipidemia, unspecified: Secondary | ICD-10-CM | POA: Insufficient documentation

## 2024-01-01 DIAGNOSIS — Z23 Encounter for immunization: Secondary | ICD-10-CM | POA: Diagnosis not present

## 2024-01-01 DIAGNOSIS — E7800 Pure hypercholesterolemia, unspecified: Secondary | ICD-10-CM | POA: Diagnosis not present

## 2024-01-01 DIAGNOSIS — Z743 Need for continuous supervision: Secondary | ICD-10-CM | POA: Diagnosis not present

## 2024-01-01 DIAGNOSIS — K2970 Gastritis, unspecified, without bleeding: Secondary | ICD-10-CM | POA: Diagnosis not present

## 2024-01-01 DIAGNOSIS — Z87891 Personal history of nicotine dependence: Secondary | ICD-10-CM | POA: Insufficient documentation

## 2024-01-01 DIAGNOSIS — F1210 Cannabis abuse, uncomplicated: Secondary | ICD-10-CM | POA: Insufficient documentation

## 2024-01-01 DIAGNOSIS — K2100 Gastro-esophageal reflux disease with esophagitis, without bleeding: Principal | ICD-10-CM | POA: Insufficient documentation

## 2024-01-01 DIAGNOSIS — R112 Nausea with vomiting, unspecified: Secondary | ICD-10-CM | POA: Diagnosis present

## 2024-01-01 DIAGNOSIS — E119 Type 2 diabetes mellitus without complications: Secondary | ICD-10-CM | POA: Diagnosis not present

## 2024-01-01 DIAGNOSIS — F17200 Nicotine dependence, unspecified, uncomplicated: Secondary | ICD-10-CM | POA: Diagnosis present

## 2024-01-01 DIAGNOSIS — K219 Gastro-esophageal reflux disease without esophagitis: Principal | ICD-10-CM | POA: Diagnosis present

## 2024-01-01 LAB — COMPREHENSIVE METABOLIC PANEL WITH GFR
ALT: 21 U/L (ref 0–44)
AST: 37 U/L (ref 15–41)
Albumin: 5 g/dL (ref 3.5–5.0)
Alkaline Phosphatase: 65 U/L (ref 38–126)
Anion gap: 13 (ref 5–15)
BUN: 18 mg/dL (ref 6–20)
CO2: 28 mmol/L (ref 22–32)
Calcium: 10.5 mg/dL — ABNORMAL HIGH (ref 8.9–10.3)
Chloride: 99 mmol/L (ref 98–111)
Creatinine, Ser: 0.91 mg/dL (ref 0.61–1.24)
GFR, Estimated: >60 mL/min (ref >60)
Glucose, Bld: 122 mg/dL — ABNORMAL HIGH (ref 70–99)
Potassium: 4 mmol/L (ref 3.5–5.1)
Sodium: 140 mmol/L (ref 135–145)
Total Bilirubin: 0.5 mg/dL (ref 0.0–1.2)
Total Protein: 8 g/dL (ref 6.5–8.1)

## 2024-01-01 LAB — CBC
HCT: 41.8 % (ref 39.0–52.0)
HCT: 41.2 % (ref 39.0–52.0)
Hemoglobin: 14.3 g/dL (ref 13.0–17.0)
Hemoglobin: 13.5 g/dL (ref 13.0–17.0)
MCH: 30.3 pg (ref 26.0–34.0)
MCH: 29.6 pg (ref 26.0–34.0)
MCHC: 34.2 g/dL (ref 30.0–36.0)
MCHC: 32.8 g/dL (ref 30.0–36.0)
MCV: 88.6 fL (ref 80.0–100.0)
MCV: 90.4 fL (ref 80.0–100.0)
Platelets: 166 K/uL (ref 150–400)
Platelets: 155 K/uL (ref 150–400)
RBC: 4.72 MIL/uL (ref 4.22–5.81)
RBC: 4.56 MIL/uL (ref 4.22–5.81)
RDW: 12.6 % (ref 11.5–15.5)
RDW: 12.7 % (ref 11.5–15.5)
WBC: 10.3 K/uL (ref 4.0–10.5)
WBC: 6.6 K/uL (ref 4.0–10.5)
nRBC: 0 % (ref 0.0–0.2)
nRBC: 0 % (ref 0.0–0.2)

## 2024-01-01 LAB — LIPASE, BLOOD: Lipase: 25 U/L (ref 11–51)

## 2024-01-01 LAB — TROPONIN T, HIGH SENSITIVITY: Troponin T High Sensitivity: <15 ng/L (ref 0–19)

## 2024-01-01 LAB — GLUCOSE, CAPILLARY: Glucose-Capillary: 134 mg/dL — ABNORMAL HIGH (ref 70–99)

## 2024-01-01 MED ORDER — ALUM & MAG HYDROXIDE-SIMETH 200-200-20 MG/5ML PO SUSP
30.0000 mL | Freq: Once | ORAL | Status: AC
  Administered 2024-01-01: 30 mL via ORAL
  Filled 2024-01-01: qty 30

## 2024-01-01 MED ORDER — INSULIN ASPART 100 UNIT/ML IJ SOLN: 0.0000 [IU] | Freq: Three times a day (TID) | INTRAMUSCULAR | Status: DC

## 2024-01-01 MED ORDER — PANTOPRAZOLE SODIUM 40 MG IV SOLR
40.0000 mg | Freq: Once | INTRAVENOUS | Status: AC
  Administered 2024-01-01: 40 mg via INTRAVENOUS
  Filled 2024-01-01: qty 10

## 2024-01-01 MED ORDER — SODIUM CHLORIDE 0.9 % IV BOLUS
1000.0000 mL | Freq: Once | INTRAVENOUS | Status: AC
  Administered 2024-01-01: 1000 mL via INTRAVENOUS

## 2024-01-01 MED ORDER — INSULIN ASPART 100 UNIT/ML IJ SOLN: 0.0000 [IU] | Freq: Every day | INTRAMUSCULAR | Status: DC

## 2024-01-01 MED ORDER — INFLUENZA VIRUS VACC SPLIT PF (FLUZONE) 0.5 ML IM SUSY
0.5000 mL | PREFILLED_SYRINGE | INTRAMUSCULAR | Status: AC | PRN
  Administered 2024-01-03: 0.5 mL via INTRAMUSCULAR
  Filled 2024-01-01: qty 0.5

## 2024-01-01 MED ORDER — PANTOPRAZOLE SODIUM 40 MG IV SOLR
40.0000 mg | Freq: Two times a day (BID) | INTRAVENOUS | Status: DC
  Administered 2024-01-01 – 2024-01-02 (×3): 40 mg via INTRAVENOUS
  Filled 2024-01-01 (×3): qty 10

## 2024-01-01 MED ORDER — ONDANSETRON HCL 4 MG/2ML IJ SOLN: 4.0000 mg | Freq: Four times a day (QID) | INTRAMUSCULAR | Status: DC | PRN

## 2024-01-01 MED ORDER — FAMOTIDINE IN NACL 20-0.9 MG/50ML-% IV SOLN
20.0000 mg | Freq: Once | INTRAVENOUS | Status: AC
  Administered 2024-01-01: 20 mg via INTRAVENOUS
  Filled 2024-01-01: qty 50

## 2024-01-01 MED ORDER — ONDANSETRON HCL 4 MG/2ML IJ SOLN
4.0000 mg | Freq: Once | INTRAMUSCULAR | Status: AC
  Administered 2024-01-01: 4 mg via INTRAVENOUS
  Filled 2024-01-01: qty 2

## 2024-01-01 MED ORDER — LIDOCAINE VISCOUS HCL 2 % MT SOLN
15.0000 mL | Freq: Once | OROMUCOSAL | Status: AC
  Administered 2024-01-01: 15 mL via ORAL
  Filled 2024-01-01: qty 15

## 2024-01-01 MED ORDER — ONDANSETRON HCL 4 MG PO TABS: 4.0000 mg | ORAL_TABLET | Freq: Four times a day (QID) | ORAL | Status: DC | PRN

## 2024-01-01 MED ORDER — IOHEXOL 300 MG/ML  SOLN
100.0000 mL | Freq: Once | INTRAMUSCULAR | Status: AC | PRN
  Administered 2024-01-01: 100 mL via INTRAVENOUS

## 2024-01-01 MED ORDER — LACTATED RINGERS IV SOLN: INTRAVENOUS | Status: AC

## 2024-01-01 NOTE — Plan of Care (Signed)
 Plan of Care Note for accepted transfer   Patient name: James Pratt FMW:[id number] DOB: [birth year]  Facility requesting transfer: [facility] ED Requesting Provider: Dr. Jerrol Reason for transfer: Acute upper GI bleed Facility course: 53 year old male with history of type 2 diabetes, hyperlipidemia, blindness of left eye, depression, GERD presenting with complaints of acid reflux, epigastric pain, and coffee-ground emesis.  Hemodynamically stable.  Hemoglobin stable.  CT showing distal esophageal thickening suspicious for reflux or esophagitis.  Gastroenterologist Dr. Kriss consulted who recommended admission and keeping n.p.o. after midnight for EGD in the morning.  Patient was given Maalox, Zofran , IV Protonix , IV Pepcid , and 1 L IV fluids.  Plan of care: The patient is accepted for admission to Telemetry unit at [facility].  [facility] will assume care on arrival to accepting facility. Until arrival, care as per EDP. However, [facility] available 24/7 for questions and assistance.  Check [email or web address] for on-call coverage.  Nursing staff, please call [facility] Admits & Consults System-Wide number under Amion on patient's arrival so appropriate admitting provider can evaluate the pt.

## 2024-01-01 NOTE — ED Notes (Signed)
Report given to [facility] RN  

## 2024-01-01 NOTE — ED Notes (Signed)
 Spoke with [facility] re: consult for gastroenterology

## 2024-01-01 NOTE — ED Notes (Signed)
 ED Provider at bedside.

## 2024-01-01 NOTE — ED Triage Notes (Signed)
 Pt c/o acid reflux, feeling of globus, emesis this AM. Associated abd pain. States he has been dealing w same for years, but vomiting is new.

## 2024-01-01 NOTE — H&P (Signed)
 History and Physical    Patient: James Pratt FMW:[id number] DOB: April 03, 1970 DOA: 01/01/2024 DOS: the patient was seen and examined on 01/01/2024 PCP: [facility]  Patient coming from: Home  Chief Complaint:  Chief Complaint  Patient presents with   Emesis   HPI: James Pratt is a 53 y.o. 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  Patient has coffee-ground emesis that was noted by family.  Was seen in the ER H&H stable.  CT showed distal esophageal thickening suspicious for reflux esophagitis.  Gastroenterologist consulted from [facility] gastroenterology Dr. Kriss who recommend admitting to keep patient n.p.o. after midnight for possible EGD in the morning.  Patient is hemodynamically stable and is admitted to the medical service.  Review of Systems: As mentioned in the history of present illness. All other systems reviewed and are negative. Past Medical History:  Diagnosis Date   Bilateral knee pain 12/29/2017   Blind left eye    Cataract    forming right eye    Contusion of toenail 05/08/2017   Depression    situational after death of brother    Diabetes mellitus without complication (HCC)    GERD (gastroesophageal reflux disease)    past hx of GERD- denies currently   Hyperlipidemia    Orthostatic hypotension 10/03/2016   Past Surgical History:  Procedure Laterality Date   EYE SURGERY Left    Social History:  reports that he quit smoking about 6 years ago. His smoking use included cigars and cigarettes. He has never used smokeless tobacco. He reports current alcohol use. He reports current drug use. Drug: Marijuana.  Allergies  Allergen Reactions   Ezetimibe Other (See Comments)   Meloxicam  Tinitus     Family History  Problem Relation Age of Onset   Colon cancer Neg Hx    Colon polyps Neg Hx    Esophageal cancer Neg Hx    Rectal cancer Neg Hx    Stomach cancer Neg Hx     Prior to Admission medications   Medication Sig Start Date End Date Taking? Authorizing Provider  acetaminophen  (TYLENOL ) 500 MG tablet Take 1,000 mg by mouth every 6 (six) hours as needed for moderate pain.    [provider]  benzonatate  (TESSALON ) 100 MG capsule Take 1 capsule (100 mg total) by mouth 3 (three) times daily as needed. 09/21/23   Silver Wonda LABOR, PA  cetirizine -pseudoephedrine  (ZYRTEC -D) 5-120 MG tablet Take 1 tablet by mouth daily as needed. 09/21/23   Silver Wonda LABOR, PA  dicyclomine  (BENTYL ) 20 MG tablet Take 1 tablet (20 mg total) by mouth 4 (four) times daily -  before meals and at bedtime. 04/08/23   Daralene Lonni BIRCH, PA-C  docusate sodium  (COLACE) 100 MG capsule Take 1 capsule (100 mg total) by mouth every 12 (twelve) hours. 04/08/23   Daralene Lonni BIRCH, PA-C  glipiZIDE  (GLUCOTROL ) 5 MG tablet Take 0.5 tablets (2.5 mg total) by mouth 2 (two) times daily before a meal. 04/26/19   Jarrett Lucie SAILOR, DO  glucose blood (ONE TOUCH ULTRA TEST) test strip Dispense QS for twice daily testing 02/22/19   Jarrett Lucie SAILOR, DO  ibuprofen  (ADVIL ,MOTRIN ) 200 MG tablet Take 800 mg by mouth every 6 (six) hours as needed for moderate pain.    [provider]  ipratropium (ATROVENT )  0.06 % nasal spray Place 2 sprays into both nostrils 4 (four) times daily. 04/14/17   Babara Greig GAILS, PA-C  metFORMIN  (GLUCOPHAGE ) 500 MG tablet TAKE TWO TABLETS TWICE DAILY WITH MEALS 02/24/21   Marlee Lynwood NOVAK, MD  methocarbamol  (ROBAXIN ) 500 MG tablet Take 1 tablet (500 mg total) by mouth 2 (two) times daily. 11/15/22   Neldon Hamp RAMAN, PA  ondansetron  (ZOFRAN ) 4 MG tablet Take 1 tablet (4 mg total) by mouth every 4 (four) hours as needed for nausea or vomiting. 03/24/21   Elnor Jayson LABOR, DO  pantoprazole   (PROTONIX ) 20 MG tablet Take 1 tablet (20 mg total) by mouth daily for 14 days. 04/08/23 04/22/23  Daralene Bruckner D, PA-C  polyethylene glycol (MIRALAX  / GLYCOLAX ) 17 g packet Take 17 g by mouth 2 (two) times daily. 06/21/21   [name], Charmaine FALCON, MD  sildenafil  (VIAGRA ) 25 MG tablet Take 1 tablet (25 mg total) by mouth daily as needed for erectile dysfunction. Take 1 hour prior to sexual activity. 03/19/19   Jarrett Lucie SAILOR, DO  SYNJARDY XR 12.07-998 MG TB24 Take 2 tablets by mouth daily. 06/04/21   [provider]  tadalafil (CIALIS) 10 MG tablet Take 10 mg by mouth daily as needed. 30 minutes before activity, do not repeat for 36 hours 07/26/22   [provider]  triamcinolone  (NASACORT ) 55 MCG/ACT AERO nasal inhaler Place 2 sprays into the nose daily. 09/21/23   Silver Wonda LABOR, PA    Physical Exam: Vitals:   01/01/24 2100 01/01/24 2130 01/01/24 2207 01/01/24 2230  BP: 119/76 127/82 117/63   Pulse: 72 77 67   Resp: 15 18 18    Temp:   98.4 F (36.9 C)   TempSrc:   Oral   SpO2: 100% 100% 98%   Weight:   69.9 kg   Height:    5' 8 (1.727 m)   Constitutional: Acutely ill looking, NAD, calm, comfortable Eyes: Left eye blindness ENMT: Mucous membranes are moist. Posterior pharynx clear of any exudate or lesions.Normal dentition.  Neck: normal, supple, no masses, no thyromegaly Respiratory: clear to auscultation bilaterally, no wheezing, no crackles. Normal respiratory effort. No accessory muscle use.  Cardiovascular: Regular rate and rhythm, no murmurs / rubs / gallops. No extremity edema. 2+ pedal pulses. No carotid bruits.  Abdomen: Epigastric tenderness no masses palpated. No hepatosplenomegaly. Bowel sounds positive.  Musculoskeletal: Good range of motion, no joint swelling or tenderness, Skin: no rashes, lesions, ulcers. No induration Neurologic: CN 2-12 grossly intact. Sensation intact, DTR normal. Strength 5/5 in all 4.  Psychiatric: Normal judgment and insight.  Alert and oriented x 3. Normal mood  Data Reviewed:  Temperature 98.4, blood pressure 130/96 glucose 122 calcium  10.5 CBC within normal hemoglobin 14.3 chest x-ray showed no acute cardiopulmonary process and mid to lower thoracic spondylosis CT abdomen pelvis with contrast shows distal esophageal wall thickening which can be seen with reflux or esophagitis  Assessment and Plan:  #1 upper GI bleed: Patient will be admitted.  Initiate IV Protonix , IV fluids, [facility] consulted, will keep patient n.p.o. after midnight for possible EGD in the morning.  #2 GERD: Continue PPI.  #3 type 2 diabetes: Continue with sliding scale insulin.  #4 hyperlipidemia: Statin may be restarted after GI workup.  Patient will be n.p.o. tonight.  #5 anxiety disorder: Will resume home regimen.  #6 remote alcohol use: Patient said he has quit drinking for a while.  Counseling given to maintain abstinence.    Advance Care  Planning:   Code Status: Full Code   Consults: [facility]  Family Communication: Daughter and wife over the phone  Severity of Illness: The appropriate patient status for this patient is INPATIENT. Inpatient status is judged to be reasonable and necessary in order to provide the required intensity of service to ensure the patient's safety. The patient's presenting symptoms, physical exam findings, and initial radiographic and laboratory data in the context of their chronic comorbidities is felt to place them at high risk for further clinical deterioration. Furthermore, it is not anticipated that the patient will be medically stable for discharge from the hospital within 2 midnights of admission.   * I certify that at the point of admission it is my clinical judgment that the patient will require inpatient hospital care spanning beyond 2 midnights from the point of admission due to high intensity of service, high risk for further deterioration and high frequency of surveillance  required.*  AuthorBETHA SIM KNOLL, MD 01/01/2024 10:56 PM  For on call review [email or web address].

## 2024-01-01 NOTE — ED Notes (Signed)
 Patient transported to CT

## 2024-01-01 NOTE — ED Provider Notes (Signed)
  EMERGENCY DEPARTMENT AT [facility] Provider Note   CSN: [id number] Arrival date & time: 01/01/24  1408     Patient presents with: Emesis   James Pratt is a 53 y.o. male.   HPI   53 year old male with medical history significant for diabetes mellitus, HLD, blindness in the left eye, depression, GERD presenting to the emergency department with multiple complaints.  The patient states that his symptoms started yesterday with GERD like symptoms of burning in his chest.  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  Today over the last 12 hours or so he has developed sudden onset nausea and vomiting.  He has had more episodes of emesis that he can count.  He endorses epigastric and right upper quadrant discomfort.  His emesis has turned into a coffee-ground like form.  He has been able to drink Gatorade just prior to arrival and has kept it down.  He endorses persistent right upper quadrant discomfort.  He also endorses persistent chest discomfort.  He endorses a globus sensation as if he is having some difficulty getting food down.  Prior to Admission medications   Medication Sig Start Date End Date Taking? Authorizing Provider  acetaminophen  (TYLENOL ) 500 MG tablet Take 1,000 mg by mouth every 6 (six) hours as needed for moderate pain.    [provider]  benzonatate  (TESSALON ) 100 MG capsule Take 1 capsule (100 mg total) by mouth 3 (three) times daily as needed. 09/21/23   Silver Wonda LABOR, PA  cetirizine -pseudoephedrine  (ZYRTEC -D) 5-120 MG tablet Take 1 tablet by mouth daily as needed. 09/21/23   Silver Wonda LABOR, PA  dicyclomine  (BENTYL ) 20 MG tablet Take 1 tablet (20 mg total) by mouth 4 (four) times daily -  before meals and at bedtime. 04/08/23   Daralene Lonni BIRCH, PA-C  docusate sodium  (COLACE) 100 MG capsule Take 1 capsule (100 mg total) by mouth every 12 (twelve) hours. 04/08/23   Daralene Lonni BIRCH, PA-C  glipiZIDE  (GLUCOTROL ) 5 MG tablet Take 0.5 tablets (2.5 mg total) by mouth 2 (two) times daily before a meal. 04/26/19   Jarrett Lucie SAILOR, DO  glucose blood (ONE TOUCH ULTRA TEST) test strip Dispense QS for twice daily testing 02/22/19   Jarrett Lucie SAILOR, DO  ibuprofen  (ADVIL ,MOTRIN ) 200 MG tablet Take 800 mg by mouth every 6 (six) hours as needed for moderate pain.    [provider]  ipratropium (ATROVENT ) 0.06 % nasal spray Place 2 sprays into both nostrils 4 (four) times daily. 04/14/17   Babara, Amy V, PA-C  metFORMIN  (GLUCOPHAGE ) 500 MG tablet TAKE TWO TABLETS TWICE DAILY WITH MEALS 02/24/21   Marlee Lynwood NOVAK, MD  methocarbamol  (ROBAXIN ) 500 MG tablet Take 1 tablet (500 mg total) by mouth 2 (two) times daily. 11/15/22   Neldon Hamp RAMAN, PA  ondansetron  (ZOFRAN ) 4 MG tablet Take 1 tablet (4 mg total) by mouth every 4 (four) hours as needed for nausea or vomiting. 03/24/21   Elnor Jayson LABOR, DO  pantoprazole  (PROTONIX ) 20 MG tablet Take 1 tablet (20 mg total) by mouth daily for 14 days. 04/08/23 04/22/23  Daralene Lonni D, PA-C  polyethylene glycol (MIRALAX  / GLYCOLAX ) 17 g packet Take 17 g by mouth 2 (two) times daily. 06/21/21   [name], Charmaine FALCON, MD  sildenafil  (VIAGRA ) 25 MG tablet Take 1 tablet (25 mg total) by mouth daily as needed for erectile dysfunction. Take 1 hour prior  to sexual activity. 03/19/19   Jarrett Lucie SAILOR, DO  SYNJARDY XR 12.07-998 MG TB24 Take 2 tablets by mouth daily. 06/04/21   [provider]  tadalafil (CIALIS) 10 MG tablet Take 10 mg by mouth daily as needed. 30 minutes before activity, do not repeat for 36 hours 07/26/22   [provider]  triamcinolone  (NASACORT ) 55 MCG/ACT AERO nasal inhaler Place 2 sprays into the nose daily. 09/21/23   Silver Wonda LABOR, PA    Allergies: Ezetimibe and Meloxicam     Review of Systems  All other systems reviewed and are negative.   Updated Vital Signs BP 117/74   Pulse 63   Temp 98  F (36.7 C)   Resp 14   SpO2 98%   Physical Exam Vitals and nursing note reviewed.  Constitutional:      General: He is not in acute distress.    Appearance: He is well-developed.  HENT:     Head: Normocephalic and atraumatic.  Eyes:     Conjunctiva/sclera: Conjunctivae normal.  Cardiovascular:     Rate and Rhythm: Normal rate and regular rhythm.     Heart sounds: No murmur heard. Pulmonary:     Effort: Pulmonary effort is normal. No respiratory distress.     Breath sounds: Normal breath sounds.  Abdominal:     Palpations: Abdomen is soft.     Tenderness: There is abdominal tenderness in the right upper quadrant and epigastric area. There is no guarding or rebound.  Musculoskeletal:        General: No swelling.     Cervical back: Neck supple.  Skin:    General: Skin is warm and dry.     Capillary Refill: Capillary refill takes less than 2 seconds.  Neurological:     Mental Status: He is alert.  Psychiatric:        Mood and Affect: Mood normal.     (all labs ordered are listed, but only abnormal results are displayed) Labs Reviewed  COMPREHENSIVE METABOLIC PANEL WITH GFR - Abnormal; Notable for the following components:      Result Value   Glucose, Bld 122 (*)    Calcium  10.5 (*)    All other components within normal limits  LIPASE, BLOOD  CBC  TROPONIN T, HIGH SENSITIVITY    EKG: EKG Interpretation Date/Time:  Monday January 01 2024 15:38:31 EDT Ventricular Rate:  71 PR Interval:  170 QRS Duration:  89 QT Interval:  405 QTC Calculation: 441 R Axis:   89  Text Interpretation: Sinus rhythm ST elev, probable normal early repol pattern No significant change since last tracing Confirmed by Jerrol Agent ([id number]) on 01/01/2024 4:44:33 PM  Radiology: DG Chest Portable 1 View Result Date: 01/01/2024 EXAM: 1 VIEW XRAY OF THE CHEST 01/01/2024 05:29:00 PM COMPARISON: 04/08/2023 CLINICAL HISTORY: Emesis, chest pain, acid reflux, globus sensation, and abdominal pain.  New onset vomiting. FINDINGS: LUNGS AND PLEURA: No focal pulmonary opacity. No pulmonary edema. No pleural effusion. No pneumothorax. HEART AND MEDIASTINUM: No acute abnormality of the cardiac and mediastinal silhouettes. BONES AND SOFT TISSUES: Mid to lower thoracic spondylosis. IMPRESSION: 1. No acute cardiopulmonary process. 2. Mid to lower thoracic spondylosis. Electronically signed by: Ryan Salvage MD 01/01/2024 05:57 PM EDT RP Workstation: [id number]   CT ABDOMEN PELVIS W CONTRAST Result Date: 01/01/2024 CLINICAL DATA:  Epigastric and right upper quadrant pain. EXAM: CT ABDOMEN AND PELVIS WITH CONTRAST TECHNIQUE: Multidetector CT imaging of the abdomen and pelvis was performed using the standard protocol following bolus  administration of intravenous contrast. RADIATION DOSE REDUCTION: This exam was performed according to the departmental dose-optimization program which includes automated exposure control, adjustment of the mA and/or kV according to patient size and/or use of iterative reconstruction technique. CONTRAST:  OMNIPAQUE  IOHEXOL  300 MG/ML  SOLN COMPARISON:  04/08/2023 FINDINGS: Lower chest: Mild distal esophageal wall thickening. No basilar airspace disease or pleural effusion. Hepatobiliary: Unremarkable appearance of the liver. Gallbladder physiologically distended, no calcified stone. No pericholecystic inflammation. No biliary dilatation. Pancreas: Unremarkable. No pancreatic ductal dilatation or surrounding inflammatory changes. Spleen: Normal in size without focal abnormality. Adrenals/Urinary Tract: No adrenal nodule. No hydronephrosis or perinephric edema. Homogeneous renal enhancement with symmetric excretion on delayed phase imaging. Urinary bladder is physiologically distended without wall thickening. Stomach/Bowel: Distal esophageal wall thickening. The stomach is unremarkable. No small bowel obstruction or inflammatory change. The appendix is normal. Small-moderate volume  of stool throughout the colon. Vascular/Lymphatic: No acute vascular findings. Normal caliber abdominal aorta. Mild atherosclerosis. The portal, splenic, and mesenteric veins are patent. No abdominopelvic adenopathy. Reproductive: Prostate is unremarkable. Other: No free air, free fluid, or intra-abdominal fluid collection. Tiny fat containing umbilical hernia. Musculoskeletal: Minor spondylosis with anterior spurring. There are no acute or suspicious osseous abnormalities. IMPRESSION: 1. Distal esophageal wall thickening, can be seen with reflux or esophagitis. 2. No other acute abnormality in the abdomen/pelvis. Aortic Atherosclerosis (ICD10-I70.0). Electronically Signed   By: Andrea Gasman M.D.   On: 01/01/2024 16:48     Procedures   Medications Ordered in the ED  pantoprazole  (PROTONIX ) injection 40 mg (has no administration in time range)  sodium chloride  0.9 % bolus 1,000 mL (0 mLs Intravenous Stopped 01/01/24 1740)  famotidine  (PEPCID ) IVPB 20 mg premix (0 mg Intravenous Stopped 01/01/24 1649)  ondansetron  (ZOFRAN ) injection 4 mg (4 mg Intravenous Given 01/01/24 1546)  iohexol  (OMNIPAQUE ) 300 MG/ML solution 100 mL (100 mLs Intravenous Contrast Given 01/01/24 1619)  alum & mag hydroxide-simeth (MAALOX/MYLANTA) 200-200-20 MG/5ML suspension 30 mL (30 mLs Oral Given 01/01/24 1739)    And  lidocaine  (XYLOCAINE ) 2 % viscous mouth solution 15 mL (15 mLs Oral Given 01/01/24 1739)                                    Medical Decision Making Amount and/or Complexity of Data Reviewed Labs: ordered. Radiology: ordered.  Risk OTC drugs. Prescription drug management. Decision regarding hospitalization.    53 year old male with medical history significant for diabetes mellitus, HLD, blindness in the left eye, depression, GERD presenting to the emergency department with multiple complaints.  The patient states that his symptoms started yesterday with GERD like symptoms of burning in his chest.   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  Today over the last 12 hours or so he has developed sudden onset nausea and vomiting.  He has had more episodes of emesis that he can count.  He endorses epigastric and right upper quadrant discomfort.  His emesis has turned into a coffee-ground like form.  He has been able to drink Gatorade just prior to arrival and has kept it down.  He endorses persistent right upper quadrant discomfort.  He also endorses persistent chest discomfort.  He endorses a globus sensation as if he is having some difficulty getting food down.  On arrival, the patient was afebrile, not tachycardic or tachypneic, hemodynamically stable.  On exam  the patient had epigastric and right upper quadrant tenderness to palpation.  Concern for severe reflux, considered gastritis, considered pancreatitis, choledocholithiasis, cholecystitis, duodenitis, less likely bowel obstruction or other acute intra-abdominal abnormality.  Considered ACS.   Initial EKG: Sinus rhythm, ventricular rate 71, early repolarization noted, no significant changes from previous EKG  CXR: IMPRESSION:  1. No acute cardiopulmonary process.  2. Mid to lower thoracic spondylosis.    CT abdomen pelvis with contrast: IMPRESSION:  1. Distal esophageal wall thickening, can be seen with reflux or  esophagitis.  2. No other acute abnormality in the abdomen/pelvis.    Aortic Atherosclerosis (ICD10-I70.0).    Labs: Cardiac troponin less than 15, do not think a repeat cardiac troponin is indicated at this time given duration of symptoms of greater than 1 day.  CBC and CMP generally unremarkable, lipase normal.  Patient was notably tolerating oral intake in the emergency department.  On recheck, he was feeling symptomatically improved.   And his report of coffee-ground emesis, gastroenterology was consulted, spoke with Dr. Kriss of [facility].  She recommend  admission, make n.p.o. at midnight for endoscopy in the morning. Hospitalist medicine consulted for admission, Dr Alfornia accepting. Pt updated regarding plan of care and was in agreement.      Final diagnoses:  Gastroesophageal reflux disease, unspecified whether esophagitis present  Coffee ground emesis    ED Discharge Orders     None          Jerrol Agent, MD 01/01/24 2117

## 2024-01-02 ENCOUNTER — Observation Stay (HOSPITAL_BASED_OUTPATIENT_CLINIC_OR_DEPARTMENT_OTHER)

## 2024-01-02 ENCOUNTER — Encounter (HOSPITAL_COMMUNITY)
Admission: EM | Disposition: A | Discharge status: Home / Self Care | Payer: Self-pay | Source: Home / Self Care | Attending: Emergency Medicine

## 2024-01-02 ENCOUNTER — Encounter (HOSPITAL_COMMUNITY): Payer: Self-pay | Admitting: Internal Medicine

## 2024-01-02 ENCOUNTER — Observation Stay (HOSPITAL_COMMUNITY)

## 2024-01-02 DIAGNOSIS — K31A15 Gastric intestinal metaplasia without dysplasia, involving multiple sites: Secondary | ICD-10-CM

## 2024-01-02 DIAGNOSIS — R1013 Epigastric pain: Secondary | ICD-10-CM | POA: Diagnosis not present

## 2024-01-02 DIAGNOSIS — F418 Other specified anxiety disorders: Secondary | ICD-10-CM | POA: Diagnosis not present

## 2024-01-02 DIAGNOSIS — K2100 Gastro-esophageal reflux disease with esophagitis, without bleeding: Secondary | ICD-10-CM | POA: Diagnosis not present

## 2024-01-02 DIAGNOSIS — K2970 Gastritis, unspecified, without bleeding: Secondary | ICD-10-CM

## 2024-01-02 DIAGNOSIS — K3189 Other diseases of stomach and duodenum: Secondary | ICD-10-CM

## 2024-01-02 DIAGNOSIS — K219 Gastro-esophageal reflux disease without esophagitis: Secondary | ICD-10-CM

## 2024-01-02 DIAGNOSIS — K922 Gastrointestinal hemorrhage, unspecified: Secondary | ICD-10-CM | POA: Diagnosis not present

## 2024-01-02 DIAGNOSIS — E119 Type 2 diabetes mellitus without complications: Secondary | ICD-10-CM | POA: Diagnosis not present

## 2024-01-02 DIAGNOSIS — K920 Hematemesis: Secondary | ICD-10-CM | POA: Diagnosis not present

## 2024-01-02 HISTORY — PX: ESOPHAGOGASTRODUODENOSCOPY: SHX5428

## 2024-01-02 LAB — CBC
HCT: 40.3 % (ref 39.0–52.0)
HCT: 40.7 % (ref 39.0–52.0)
HCT: 40.5 % (ref 39.0–52.0)
HCT: 39.8 % (ref 39.0–52.0)
Hemoglobin: 13.4 g/dL (ref 13.0–17.0)
Hemoglobin: 13.8 g/dL (ref 13.0–17.0)
Hemoglobin: 13.4 g/dL (ref 13.0–17.0)
Hemoglobin: 13.1 g/dL (ref 13.0–17.0)
MCH: 30 pg (ref 26.0–34.0)
MCH: 30.6 pg (ref 26.0–34.0)
MCH: 30.4 pg (ref 26.0–34.0)
MCH: 29.7 pg (ref 26.0–34.0)
MCHC: 33.3 g/dL (ref 30.0–36.0)
MCHC: 33.9 g/dL (ref 30.0–36.0)
MCHC: 33.1 g/dL (ref 30.0–36.0)
MCHC: 32.9 g/dL (ref 30.0–36.0)
MCV: 90.4 fL (ref 80.0–100.0)
MCV: 90.2 fL (ref 80.0–100.0)
MCV: 91.8 fL (ref 80.0–100.0)
MCV: 90.2 fL (ref 80.0–100.0)
Platelets: 153 K/uL (ref 150–400)
Platelets: 153 K/uL (ref 150–400)
Platelets: 143 K/uL — ABNORMAL LOW (ref 150–400)
Platelets: 150 K/uL (ref 150–400)
RBC: 4.46 MIL/uL (ref 4.22–5.81)
RBC: 4.51 MIL/uL (ref 4.22–5.81)
RBC: 4.41 MIL/uL (ref 4.22–5.81)
RBC: 4.41 MIL/uL (ref 4.22–5.81)
RDW: 12.7 % (ref 11.5–15.5)
RDW: 12.7 % (ref 11.5–15.5)
RDW: 12.6 % (ref 11.5–15.5)
RDW: 12.5 % (ref 11.5–15.5)
WBC: 5.2 K/uL (ref 4.0–10.5)
WBC: 5 K/uL (ref 4.0–10.5)
WBC: 4.5 K/uL (ref 4.0–10.5)
WBC: 6.4 K/uL (ref 4.0–10.5)
nRBC: 0 % (ref 0.0–0.2)
nRBC: 0 % (ref 0.0–0.2)
nRBC: 0 % (ref 0.0–0.2)
nRBC: 0 % (ref 0.0–0.2)

## 2024-01-02 LAB — COMPREHENSIVE METABOLIC PANEL WITH GFR
ALT: 15 U/L (ref 0–44)
AST: 25 U/L (ref 15–41)
Albumin: 4.1 g/dL (ref 3.5–5.0)
Alkaline Phosphatase: 51 U/L (ref 38–126)
Anion gap: 10 (ref 5–15)
BUN: 17 mg/dL (ref 6–20)
CO2: 25 mmol/L (ref 22–32)
Calcium: 9.2 mg/dL (ref 8.9–10.3)
Chloride: 105 mmol/L (ref 98–111)
Creatinine, Ser: 0.9 mg/dL (ref 0.61–1.24)
GFR, Estimated: >60 mL/min (ref >60)
Glucose, Bld: 85 mg/dL (ref 70–99)
Potassium: 3.9 mmol/L (ref 3.5–5.1)
Sodium: 140 mmol/L (ref 135–145)
Total Bilirubin: 0.6 mg/dL (ref 0.0–1.2)
Total Protein: 6.5 g/dL (ref 6.5–8.1)

## 2024-01-02 LAB — GLUCOSE, CAPILLARY
Glucose-Capillary: 90 mg/dL (ref 70–99)
Glucose-Capillary: 84 mg/dL (ref 70–99)
Glucose-Capillary: 66 mg/dL — ABNORMAL LOW (ref 70–99)
Glucose-Capillary: 72 mg/dL (ref 70–99)
Glucose-Capillary: 223 mg/dL — ABNORMAL HIGH (ref 70–99)

## 2024-01-02 LAB — HEMOGLOBIN A1C
Hgb A1c MFr Bld: 6.5 % — ABNORMAL HIGH (ref 4.8–5.6)
Mean Plasma Glucose: 139.85 mg/dL

## 2024-01-02 LAB — HIV ANTIBODY (ROUTINE TESTING W REFLEX): HIV Screen 4th Generation wRfx: NONREACTIVE

## 2024-01-02 SURGERY — EGD (ESOPHAGOGASTRODUODENOSCOPY)
Anesthesia: Monitor Anesthesia Care

## 2024-01-02 MED ORDER — FENTANYL CITRATE (PF) 100 MCG/2ML IJ SOLN
INTRAMUSCULAR | Status: DC | PRN
  Administered 2024-01-02: 50 ug via INTRAVENOUS

## 2024-01-02 MED ORDER — SODIUM CHLORIDE 0.9 % IV SOLN: INTRAVENOUS | Status: DC

## 2024-01-02 MED ORDER — INSULIN ASPART 100 UNIT/ML IJ SOLN
0.0000 [IU] | INTRAMUSCULAR | Status: DC
  Administered 2024-01-02: 2 [IU] via SUBCUTANEOUS
  Administered 2024-01-03: 1 [IU] via SUBCUTANEOUS

## 2024-01-02 MED ORDER — PROPOFOL 10 MG/ML IV BOLUS
INTRAVENOUS | Status: DC | PRN
  Administered 2024-01-02: 50 mg via INTRAVENOUS

## 2024-01-02 MED ORDER — LIDOCAINE 2% (20 MG/ML) 5 ML SYRINGE
INTRAMUSCULAR | Status: DC | PRN
  Administered 2024-01-02: 100 mg via INTRAVENOUS

## 2024-01-02 MED ORDER — SODIUM CHLORIDE 0.9 % IV SOLN: INTRAVENOUS | Status: DC | PRN

## 2024-01-02 MED ORDER — PROPOFOL 1000 MG/100ML IV EMUL
INTRAVENOUS | Status: AC
  Filled 2024-01-02: qty 100

## 2024-01-02 MED ORDER — PROPOFOL 500 MG/50ML IV EMUL
INTRAVENOUS | Status: DC | PRN
  Administered 2024-01-02: 160 ug/kg/min via INTRAVENOUS

## 2024-01-02 MED ORDER — FENTANYL CITRATE (PF) 100 MCG/2ML IJ SOLN
INTRAMUSCULAR | Status: AC
  Filled 2024-01-02: qty 2

## 2024-01-02 NOTE — Transfer of Care (Signed)
 Immediate Anesthesia Transfer of Care Note  Patient: James Pratt  Procedure(s) Performed: EGD (ESOPHAGOGASTRODUODENOSCOPY)  Patient Location: Endoscopy Unit  Anesthesia Type:MAC  Level of Consciousness: sedated and responds to stimulation  Airway & Oxygen  Therapy: Patient Spontanous Breathing and Patient connected to nasal cannula oxygen   Post-op Assessment: Report given to RN and Post -op Vital signs reviewed and stable  Post vital signs: Reviewed and stable  Last Vitals:  Vitals Value Taken Time  BP 85/50 01/02/24 16:44  Temp    Pulse 76 01/02/24 16:46  Resp 14 01/02/24 16:46  SpO2 99 % 01/02/24 16:46  Vitals shown include unfiled device data.  Last Pain:  Vitals:   01/02/24 1435  TempSrc: Temporal  PainSc: 2       Patients Stated Pain Goal: 0 (01/02/24 0951)  Complications: No notable events documented.

## 2024-01-02 NOTE — Consult Note (Addendum)
 Consultation  Referring Provider:  Kennedy Kreiger Pratt  Primary Care Physician:  James Pratt, James Pratt Primary Gastroenterologist:  James Pratt       Reason for Consultation: Coffee-ground emesis      LOS: 0 days          HPI:   James Pratt is Pratt 53 y.o. 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  Patient states yesterday morning (10/27) after waking up and consuming Pratt breakfast sandwich she had progressive vomiting.  States his vomit was initially the food he had just eaten then followed by coffee ground emesis.  No previous episodes of this.  His dinner the night before was stromboli dipped in [place].  He states he has Pratt chronic history with GERD which is so intense that when he bends over at his job he will often feel regurgitation and burning.  He controls his GERD with Tums.  No previous EGD.  No NSAIDs.  Denies weight loss.  He does report some epigastric pain that is worse with eating.  CT abdomen pelvis with contrast significant for distal esophageal wall thickening concerning for reflux/esophagitis, otherwise unrevealing.  Normal CBC/CMP without elevated liver enzymes or leukocytosis  PREVIOUS GI WORKUP   Screening colonoscopy in 2021 - Normal - Repeat 10 years  Past Pratt History:  Diagnosis Date   Bilateral knee pain 12/29/2017   Blind left eye    Cataract    forming right eye    Contusion of toenail 05/08/2017   Depression    situational after death of brother    Diabetes mellitus without complication (HCC)    GERD (gastroesophageal reflux disease)    past hx of GERD- denies currently   Hyperlipidemia    Orthostatic hypotension 10/03/2016    Surgical History:  He  has Pratt past surgical history that includes Eye surgery (Left). Family History:  His family history is not on  file. Social History:   reports that he quit smoking about 6 years ago. His smoking use included cigars and cigarettes. He has never used smokeless tobacco. He reports current alcohol use. He reports current drug use. Drug: Marijuana.  Prior to Admission medications   Medication Sig Start Date End Date Taking? Authorizing Provider  acetaminophen  (TYLENOL ) 500 MG tablet Take 1,000 mg by mouth every 6 (six) hours as needed for moderate pain.   Yes Provider, Historical, James Pratt  SYNJARDY XR 12.07-998 MG TB24 Take 1 tablet by mouth daily. 06/04/21  Yes Provider, Historical, James Pratt  benzonatate  (TESSALON ) 100 MG capsule Take 1 capsule (100 mg total) by mouth 3 (three) times daily as needed. Patient not taking: Reported on 01/01/2024 09/21/23   Silver Fell Pratt, James Pratt  cetirizine -pseudoephedrine  (ZYRTEC -D) 5-120 MG tablet Take 1 tablet by mouth daily as needed. Patient not taking: Reported on 01/01/2024 09/21/23   Silver Fell LABOR, James Pratt  dicyclomine  (BENTYL ) 20 MG tablet Take 1 tablet (20 mg total) by mouth 4 (four) times daily -  before meals and at bedtime. Patient not taking: Reported on 01/01/2024 04/08/23   James Lonni BIRCH, James Pratt-C  docusate sodium  (COLACE) 100 MG capsule Take 1 capsule (100 mg total) by mouth every 12 (twelve) hours. Patient not taking: Reported on 01/01/2024 04/08/23   James Lonni BIRCH, James Pratt-C  glipiZIDE  (GLUCOTROL ) 5 MG tablet Take 0.5 tablets (2.5 mg total) by mouth 2 (two) times  daily before Pratt meal. Patient not taking: Reported on 01/01/2024 04/26/19   James Lucie SAILOR, James Pratt  glucose blood (ONE TOUCH ULTRA TEST) test strip Dispense QS for twice daily testing 02/22/19   James Lucie SAILOR, James Pratt  ibuprofen  (ADVIL ,MOTRIN ) 200 MG tablet Take 800 mg by mouth every 6 (six) hours as needed for moderate pain. Patient not taking: Reported on 01/01/2024    Provider, Historical, James Pratt  ipratropium (ATROVENT ) 0.06 % nasal spray Place 2 sprays into both nostrils 4 (four) times daily. Patient not taking:  Reported on 01/01/2024 04/14/17   James Pratt GAILS, James Pratt-C  metFORMIN  (GLUCOPHAGE ) 500 MG tablet TAKE TWO TABLETS TWICE DAILY WITH MEALS Patient not taking: Reported on 01/01/2024 02/24/21   James Lynwood NOVAK, James Pratt  methocarbamol  (ROBAXIN ) 500 MG tablet Take 1 tablet (500 mg total) by mouth 2 (two) times daily. Patient not taking: Reported on 01/01/2024 11/15/22   James Hamp RAMAN, James Pratt  ondansetron  (ZOFRAN ) 4 MG tablet Take 1 tablet (4 mg total) by mouth every 4 (four) hours as needed for nausea or vomiting. Patient not taking: Reported on 01/01/2024 03/24/21   James Savant Pratt, James Pratt  pantoprazole  (PROTONIX ) 20 MG tablet Take 1 tablet (20 mg total) by mouth daily for 14 days. Patient not taking: Reported on 01/01/2024 04/08/23 01/01/24  James Bruckner D, James Pratt-C  polyethylene glycol (MIRALAX  / GLYCOLAX ) 17 g packet Take 17 g by mouth 2 (two) times daily. Patient not taking: Reported on 01/01/2024 06/21/21   James Pratt, James FALCON, James Pratt  sildenafil  (VIAGRA ) 25 MG tablet Take 1 tablet (25 mg total) by mouth daily as needed for erectile dysfunction. Take 1 hour prior to sexual activity. Patient not taking: Reported on 01/01/2024 03/19/19   James Lucie SAILOR, James Pratt  triamcinolone  (NASACORT ) 55 MCG/ACT AERO nasal inhaler Place 2 sprays into the nose daily. Patient not taking: Reported on 01/01/2024 09/21/23   Silver Wonda LABOR, James Pratt    Current Facility-Administered Medications  Medication Dose Route Frequency Provider Last Rate Last Admin   influenza vac split trivalent PF (FLUZONE) injection 0.5 mL  0.5 mL Intramuscular Prior to discharge James Emery CROME, James Pratt       insulin aspart (novoLOG) injection 0-5 Units  0-5 Units Subcutaneous QHS James Pratt, James Pratt, James Pratt       insulin aspart (novoLOG) injection 0-9 Units  0-9 Units Subcutaneous TID WC James Pratt, James Pratt, James Pratt       lactated ringers infusion   Intravenous Continuous James Emery CROME, James Pratt 100 mL/hr at 01/02/24 0840 New Bag at 01/02/24 0840   ondansetron  (ZOFRAN ) tablet 4 mg  4 mg  Oral Q6H PRN James Emery CROME, James Pratt       Or   ondansetron  (ZOFRAN ) injection 4 mg  4 mg Intravenous Q6H PRN James Pratt, James Pratt, James Pratt       pantoprazole  (PROTONIX ) injection 40 mg  40 mg Intravenous Q12H James Emery CROME, James Pratt   40 mg at 01/02/24 9157    Allergies as of 01/01/2024 - Review Complete 01/01/2024  Allergen Reaction Noted   Ezetimibe Other (See Comments) 11/23/2021   Meloxicam  Tinitus 10/06/2016    Review of Systems  Constitutional:  Negative for chills, fever and weight loss.  HENT:  Negative for hearing loss and tinnitus.   Eyes:  Negative for blurred vision.  Respiratory:  Negative for cough and hemoptysis.   Cardiovascular:  Negative for chest pain and palpitations.  Gastrointestinal:  Positive for abdominal pain, heartburn, nausea and vomiting. Negative for blood in stool, constipation, diarrhea and  melena.  Genitourinary:  Negative for dysuria and urgency.  Musculoskeletal:  Negative for myalgias and neck pain.  Skin:  Negative for itching and rash.  Neurological:  Negative for seizures and loss of consciousness.  Psychiatric/Behavioral:  Negative for depression and suicidal ideas.        Physical Exam:  Vital signs in last 24 hours: Temp:  [98 F (36.7 C)-98.5 F (36.9 C)] 98.5 F (36.9 C) (10/28 1017) Pulse Rate:  [63-80] 78 (10/28 1017) Resp:  [13-18] 16 (10/28 1017) BP: (117-138)/(63-96) 119/76 (10/28 1017) SpO2:  [97 %-100 %] 98 % (10/28 1017) Weight:  [69.9 kg] 69.9 kg (10/27 2207)   Last BM recorded by nurses in past 5 days No data recorded  Physical Exam Constitutional:      Appearance: Normal appearance.  HENT:     Head: Normocephalic and atraumatic.     Nose: Nose normal. No congestion.  Eyes:     Extraocular Movements: Extraocular movements intact.     Conjunctiva/sclera: Conjunctivae normal.  Cardiovascular:     Rate and Rhythm: Normal rate and regular rhythm.  Abdominal:     General: Bowel sounds are normal. There is no distension.      Palpations: Abdomen is soft. There is no mass.     Tenderness: There is no abdominal tenderness. There is no guarding.     Hernia: No hernia is present.  Musculoskeletal:        General: No swelling. Normal range of motion.     Cervical back: Normal range of motion and neck supple.  Skin:    General: Skin is warm and dry.  Neurological:     General: No focal deficit present.     Mental Status: He is alert and oriented to person, place, and time.  Psychiatric:        Mood and Affect: Mood normal.        Behavior: Behavior normal.        Thought Content: Thought content normal.        Judgment: Judgment normal.      LAB RESULTS: Recent Labs    01/01/24 1418 01/01/24 2310 01/02/24 0412  WBC 10.3 6.6 5.2  HGB 14.3 13.5 13.4  HCT 41.8 41.2 40.3  PLT 166 155 153   BMET Recent Labs    01/01/24 1418 01/02/24 0412  NA 140 140  K 4.0 3.9  CL 99 105  CO2 28 25  GLUCOSE 122* 85  BUN 18 17  CREATININE 0.91 0.90  CALCIUM  10.5* 9.2   LFT Recent Labs    01/02/24 0412  PROT 6.5  ALBUMIN 4.1  AST 25  ALT 15  ALKPHOS 51  BILITOT 0.6   PT/INR No results for input(s): LABPROT, INR in the last 72 hours.  STUDIES: DG Chest Portable 1 View Result Date: 01/01/2024 EXAM: 1 VIEW XRAY OF THE CHEST 01/01/2024 05:29:00 PM COMPARISON: 04/08/2023 CLINICAL HISTORY: Emesis, chest pain, acid reflux, globus sensation, and abdominal pain. New onset vomiting. FINDINGS: LUNGS AND PLEURA: No focal pulmonary opacity. No pulmonary edema. No pleural effusion. No pneumothorax. HEART AND MEDIASTINUM: No acute abnormality of the cardiac and mediastinal silhouettes. BONES AND SOFT TISSUES: Mid to lower thoracic spondylosis. IMPRESSION: 1. No acute cardiopulmonary process. 2. Mid to lower thoracic spondylosis. Electronically signed by: Ryan Salvage James Pratt 01/01/2024 05:57 PM EDT RP Workstation: [id number]   CT ABDOMEN PELVIS W CONTRAST Result Date: 01/01/2024 CLINICAL DATA:  Epigastric and  right upper quadrant pain. EXAM: CT ABDOMEN AND PELVIS  WITH CONTRAST TECHNIQUE: Multidetector CT imaging of the abdomen and pelvis was performed using the standard protocol following bolus administration of intravenous contrast. RADIATION DOSE REDUCTION: This exam was performed according to the departmental dose-optimization program which includes automated exposure control, adjustment of the mA and/or kV according to patient size and/or use of iterative reconstruction technique. CONTRAST:  OMNIPAQUE  IOHEXOL  300 MG/ML  SOLN COMPARISON:  04/08/2023 FINDINGS: Lower chest: Mild distal esophageal wall thickening. No basilar airspace disease or pleural effusion. Hepatobiliary: Unremarkable appearance of the liver. Gallbladder physiologically distended, no calcified stone. No pericholecystic inflammation. No biliary dilatation. Pancreas: Unremarkable. No pancreatic ductal dilatation or surrounding inflammatory changes. Spleen: Normal in size without focal abnormality. Adrenals/Urinary Tract: No adrenal nodule. No hydronephrosis or perinephric edema. Homogeneous renal enhancement with symmetric excretion on delayed phase imaging. Urinary bladder is physiologically distended without wall thickening. Stomach/Bowel: Distal esophageal wall thickening. The stomach is unremarkable. No small bowel obstruction or inflammatory change. The appendix is normal. Small-moderate volume of stool throughout the colon. Vascular/Lymphatic: No acute vascular findings. Normal caliber abdominal aorta. Mild atherosclerosis. The portal, splenic, and mesenteric veins are patent. No abdominopelvic adenopathy. Reproductive: Prostate is unremarkable. Other: No free air, free fluid, or intra-abdominal fluid collection. Tiny fat containing umbilical hernia. Musculoskeletal: Minor spondylosis with anterior spurring. There are no acute or suspicious osseous abnormalities. IMPRESSION: 1. Distal esophageal wall thickening, can be seen with reflux or  esophagitis. 2. No other acute abnormality in the abdomen/pelvis. Aortic Atherosclerosis (ICD10-I70.0). Electronically Signed   By: Andrea Gasman M.D.   On: 01/01/2024 16:48      Impression    Coffee-ground emesis GERD Epigastric pain No anemia, no leukocytosis, no elevated LFTs CTAP with contrast with distal esophageal wall thickening, otherwise normal with normal gallbladder DDx suspicious for esophagitis with his chronic history of GERD only managed on Tums as well as CT scan as above.  No vomiting since yesterday.SABRA  No NSAIDs.  Recently returned from [place] 6 months ago so there is also suspicion for H. pylori - PPI IV twice daily - Continue supportive care - Consider EGD with biopsies for further evaluation - Continue daily CBC and transfuse as needed to maintain HGB > 7   Diabetes  Thank you for your kind consultation, we will continue to follow.   Bayley CHRISTELLA Blower  01/02/2024, 10:46 AM     Attending physician's note   I have taken history, reviewed the chart and examined the patient. I performed Pratt substantive portion of this encounter, including complete performance of at least one of the key components, in conjunction with the APP. I agree with the Advanced Practitioner's note, impression and recommendations.   CGE with epi pain. CT AP with eso thickening. HD stable. Hb stable GERD. No dysphagia H/O remote EtOH use.  No cirrhosis on CT Neg screening colon 2021. Next due 10 yrs.  Plan: -IV Protonix . -EGD today.  -CT reviewed independently. -Trend CBC. -Avoid nonsteroidals.  I have discussed the risks and benefits of EGD. The risks including rare risk of perforation, bleeding, missed UGI neoplasms, risks of anesthesia/sedation. Alternatives were given. Patient is aware and agrees to proceed. All the questions were answered.    Anselm Bring, James Pratt [facility] [phone]

## 2024-01-02 NOTE — Anesthesia Postprocedure Evaluation (Signed)
 Anesthesia Post Note  Patient: James Pratt  Procedure(s) Performed: EGD (ESOPHAGOGASTRODUODENOSCOPY)     Patient location during evaluation: PACU Anesthesia Type: MAC Level of consciousness: awake and alert Pain management: pain level controlled Vital Signs Assessment: post-procedure vital signs reviewed and stable Respiratory status: spontaneous breathing, nonlabored ventilation and respiratory function stable Cardiovascular status: blood pressure returned to baseline Postop Assessment: no apparent nausea or vomiting Anesthetic complications: no   No notable events documented.  Last Vitals:  Vitals:   01/02/24 1710 01/02/24 1721  BP: 126/71 127/77  Pulse: 77 70  Resp: 17 17  Temp:  36.4 C  SpO2: 97% 100%    Last Pain:  Vitals:   01/02/24 1721  TempSrc: Oral  PainSc:                  Vertell Row

## 2024-01-02 NOTE — Op Note (Signed)
 [facility] Patient Name: James Pratt Procedure Date: 01/02/2024 MRN: [id number] Attending MD: Lynnie Bring , MD, [id number] Date of Birth: 1971-03-06 CSN: [id number] Age: 53 Admit Type: Inpatient Procedure:                Upper GI endoscopy Indications:              H/O Hematemesis, Abnormal CT of the GI tract                            showing distal esophageal thickening Providers:                Lynnie Bring, MD, Hoy Penner, RN, Coye Bade, Technician Referring MD:              Medicines:                Monitored Anesthesia Care Complications:            No immediate complications. Estimated Blood Loss:     Estimated blood loss: none. Procedure:                Pre-Anesthesia Assessment:                           - Prior to the procedure, a History and Physical                            was performed, and patient medications and                            allergies were reviewed. The patient's tolerance of                            previous anesthesia was also reviewed. The risks                            and benefits of the procedure and the sedation                            options and risks were discussed with the patient.                            All questions were answered, and informed consent                            was obtained. Prior Anticoagulants: The patient has                            taken no anticoagulant or antiplatelet agents. ASA                            Grade Assessment: II - A patient with mild systemic  disease. After reviewing the risks and benefits,                            the patient was deemed in satisfactory condition to                            undergo the procedure.                           After obtaining informed consent, the endoscope was                            passed under direct vision. Throughout the                             procedure, the patient's blood pressure, pulse, and                            oxygen  saturations were monitored continuously. The                            [id number]-[id number] ([id number]) Olympus endoscope was introduced                            through the mouth, and advanced to the second part                            of duodenum. The upper GI endoscopy was                            accomplished without difficulty. The patient                            tolerated the procedure well. Scope In: Scope Out: Findings:      LA Grade C (one or more mucosal breaks continuous between tops of 2 or       more mucosal folds, less than 75% circumference) esophagitis with no       bleeding was found 36 to 38 cm from the incisors. Biopsies were taken       with a cold forceps for histology.      Diffuse moderate inflammation characterized by congestion (edema) and       erythema was found in the gastric body and in the gastric antrum.       Biopsies were taken with a cold forceps for histology.      Localized mild inflammation characterized by erosions was found in the       cardia. Biopsies were taken with a cold forceps for histology.      The examined duodenum was normal. Impression:               - LA Grade C reflux esophagitis with no bleeding.                            Biopsied.                           -  Gastritis. Biopsied.                           - Normal examined duodenum. Moderate Sedation:      Not Applicable - Patient had care per Anesthesia. Recommendation:           - Return patient to hospital ward for ongoing care.                           - Resume previous diet.                           - Protonix  40 mg p.o. twice daily x 6 weeks, then QD                           - Recheck CBC in AM. If OK, can D/C home with GI FU                           - Await pathology results.                           - Avoid ibuprofen , naproxen , or other non-steroidal                             anti-inflammatory drugs.                           - FU GI clinic as outpt in 6-8 weeks                           - The findings and recommendations were discussed                            with the patient. Procedure Code(s):        --- Professional ---                           [phone], Esophagogastroduodenoscopy, flexible,                            transoral; with biopsy, single or multiple Diagnosis Code(s):        --- Professional ---                           K21.00, Gastro-esophageal reflux disease with                            esophagitis, without bleeding                           K29.70, Gastritis, unspecified, without bleeding                           K92.0, Hematemesis                           R93.3, Abnormal findings  on diagnostic imaging of                            other parts of digestive tract CPT copyright 2022 [employer]. All rights reserved. The codes documented in this report are preliminary and upon coder review may  be revised to meet current compliance requirements. Lynnie Bring, MD 01/02/2024 4:48:42 PM This report has been signed electronically. Number of Addenda: 0

## 2024-01-02 NOTE — Anesthesia Procedure Notes (Signed)
 Procedure Name: MAC Date/Time: 01/02/2024 4:20 PM  Performed by: Obadiah Reyes BROCKS, CRNAPre-anesthesia Checklist: Patient identified, Emergency Drugs available, Suction available, Patient being monitored and Timeout performed Patient Re-evaluated:Patient Re-evaluated prior to induction Oxygen  Delivery Method: Simple face mask Preoxygenation: Pre-oxygenation with 100% oxygen  Induction Type: IV induction

## 2024-01-02 NOTE — Plan of Care (Incomplete)
  Problem: Education: Goal: Knowledge of General Education information will improve Description: Including pain rating scale, medication(s)/side effects and non-pharmacologic comfort measures Outcome: Progressing   Problem: Health Behavior/Discharge Planning: Goal: Ability to manage health-related needs will improve Outcome: Progressing   Problem: Clinical Measurements: Goal: Ability to maintain clinical measurements within normal limits will improve Outcome: Progressing Goal: Diagnostic test results will improve Outcome: Progressing   Problem: Nutrition: Goal: Adequate nutrition will be maintained Outcome: Progressing   Problem: Clinical Measurements: Goal: Will remain free from infection Outcome: Adequate for Discharge Goal: Respiratory complications will improve Outcome: Adequate for Discharge Goal: Cardiovascular complication will be avoided Outcome: Adequate for Discharge   Problem: Activity: Goal: Risk for activity intolerance will decrease Outcome: Adequate for Discharge   Problem: Coping: Goal: Level of anxiety will decrease Outcome: Adequate for Discharge   Problem: Elimination: Goal: Will not experience complications related to bowel motility Outcome: Adequate for Discharge Goal: Will not experience complications related to urinary retention Outcome: Adequate for Discharge   Problem: Pain Managment: Goal: General experience of comfort will improve and/or be controlled Outcome: Adequate for Discharge   Problem: Safety: Goal: Ability to remain free from injury will improve Outcome: Adequate for Discharge   Problem: Skin Integrity: Goal: Risk for impaired skin integrity will decrease Outcome: Adequate for Discharge

## 2024-01-02 NOTE — Progress Notes (Signed)
 Hypoglycemic Event  CBG: 66  Treatment:   None. MDA Calhoun notified. Per MDA Calhoun, will hold off on tx at this time. No new orders.  Symptoms: None  Possible Reasons for Event: Inadequate meal intake - NPO since midnight for EGD  Comments/MD notified: MDA Calhoun notified. No new orders at this time. Per MDA Calhoun, will hold off on tx for now, MDA Calhoun to assess pt.    James Pratt 01/02/24 2:49 PM

## 2024-01-02 NOTE — Progress Notes (Addendum)
 PROGRESS NOTE    James Pratt  FMW:[id number] DOB: 04/16/1970 DOA: 01/01/2024 PCP: [facility]    Brief Narrative:   James Pratt is a 53 y.o. male with past medical history significant for DM2, history of EtOH use disorder now in remission, left eye blindness, HLD, depression, GERD who presented to [facility] on 01/01/2024 with nausea and vomiting with reported hematemesis x 2.  Patient reports coffee-ground emesis noted by family.  Patient reports occasional use of NSAIDs, but not excessive.  Denies any active alcohol use.  Denies any anticoagulant/antiplatelet use.  In the ED, temperature 98.0 F, HR 80, RR 16, BP 123/83, SpO2 97% on room air.  WBC 10.3, hemoglobin 14.3, platelet count 166.  Sodium 140, potassium 4.0, chloride 99, CO2 28, glucose 122, BUN 18, creatinine 0.91.  Lipase 25.  AST 37, ALT 21, total bilirubin 0.5.  High since he troponin less than 15.  Chest x-ray with no acute cardiopulmonary process.  CT abdomen/pelvis with contrast with distal esophageal wall thickening consistent with reflux or esophagitis, no other acute abnormality in the abdomen/pelvis.  [facility] was consulted and recommended transfer to [facility] under the hospitalist service for further evaluation and management and potential EGD.  Assessment & Plan:   Hematemesis with concern for UGIB Esophageal wall thickening Patient presenting to ED with nausea, vomiting and hematemesis x 2.  Previous history of alcohol use disorder, now remission.  Also endorses occasional NSAID use, but not excessive.  Not on anticoagulant/antiplatelet.  Hemoglobin 14.3 on admission.  CT abdomen/pelvis with distal esophageal wall thickening consistent with reflux versus esophagitis without any further abnormality in the abdomen/pelvis. -- [facility] following, appreciate assistance -- Hgb 14.3>13.5>13.4 -- Protonix  40 mg IV every 12 hours -- LR  at 100 mL/h -- CBC every 6 hours  DM2 Hemoglobin A1c 6.5%.  Diet controlled.  No longer taking any medication. -- Very sensitive SSI for coverage -- CBG every 4 hours while NPO  HLD Currently not taking any medication  Depression Currently not taking any medication  GERD -- Protonix  IV as above  Left eye blindness Supportive care  History of EtOH use disorder, now in remission Continue to encourage complete abstinence/cessation.   DVT prophylaxis: SCDs Start: 01/01/24 2255    Code Status: Full Code Family Communication: No family present at bedside this morning  Disposition Plan:  Level of care: Telemetry Status is: Observation The patient remains OBS appropriate and will d/c before 2 midnights.    Consultants:  [facility]  Procedures:  None  Antimicrobials:  None   Subjective: Patient seen examined bedside, lying in bed.  No complaints.  Denies any further episodes of nausea, vomiting or hematemesis.  Does report recent darker stools.  No bowel movement since arrival to the hospital.  Awaiting GI evaluation.  No other questions, concerns or complaints at this time.  Denies headache, no dizziness, no chest pain, no palpitations, no shortness of breath, no abdominal pain, no fever/chills/night sweats, no nausea/vomiting/diarrhea, no focal weakness, no fatigue, no paresthesia.  No acute events overnight per nurse staff.  Objective: Vitals:   01/01/24 2207 01/01/24 2230 01/02/24 0500 01/02/24 1017  BP: 117/63  120/75 119/76  Pulse: 67  78 78  Resp: 18  13 16   Temp: 98.4 F (36.9 C)  98.2 F (36.8 C) 98.5 F (36.9 C)  TempSrc: Oral  Oral Oral  SpO2: 98%  99% 98%  Weight: 69.9 kg  Height:  5' 8 (1.727 m)      Intake/Output Summary (Last 24 hours) at 01/02/2024 1126 Last data filed at 01/02/2024 0619 Gross per 24 hour  Intake 1743.09 ml  Output --  Net 1743.09 ml   Filed Weights   01/01/24 2207  Weight: 69.9 kg     Examination:  Physical Exam: GEN: NAD, alert and oriented x 3, wd/wn HEENT: NCAT, PERRL, EOMI, sclera clear, MMM PULM: CTAB w/o wheezes/crackles, normal respiratory effort CV: RRR w/o M/G/R GI: abd soft, NTND, NABS, no R/G/M MSK: no peripheral edema, muscle strength globally intact 5/5 bilateral upper/lower extremities NEURO: CN II-XII intact, no focal deficits, sensation to light touch intact PSYCH: normal mood/affect Integumentary: dry/intact, no rashes or wounds    Data Reviewed: I have personally reviewed following labs and imaging studies  CBC: Recent Labs  Lab 01/01/24 1418 01/01/24 2310 01/02/24 0412 01/02/24 1054  WBC 10.3 6.6 5.2 5.0  HGB 14.3 13.5 13.4 13.8  HCT 41.8 41.2 40.3 40.7  MCV 88.6 90.4 90.4 90.2  PLT 166 155 153 153   Basic Metabolic Panel: Recent Labs  Lab 01/01/24 1418 01/02/24 0412  NA 140 140  K 4.0 3.9  CL 99 105  CO2 28 25  GLUCOSE 122* 85  BUN 18 17  CREATININE 0.91 0.90  CALCIUM  10.5* 9.2   GFR: Estimated Creatinine Clearance: 90.8 mL/min (by C-G formula based on SCr of 0.9 mg/dL). Liver Function Tests: Recent Labs  Lab 01/01/24 1418 01/02/24 0412  AST 37 25  ALT 21 15  ALKPHOS 65 51  BILITOT 0.5 0.6  PROT 8.0 6.5  ALBUMIN 5.0 4.1   Recent Labs  Lab 01/01/24 1418  LIPASE 25   No results for input(s): AMMONIA in the last 168 hours. Coagulation Profile: No results for input(s): INR, PROTIME in the last 168 hours. Cardiac Enzymes: No results for input(s): CKTOTAL, CKMB, CKMBINDEX, TROPONINI in the last 168 hours. BNP (last 3 results) No results for input(s): PROBNP in the last 8760 hours. HbA1C: Recent Labs    01/01/24 2310  HGBA1C 6.5*   CBG: Recent Labs  Lab 01/01/24 2305 01/02/24 0750  GLUCAP 134* 90   Lipid Profile: No results for input(s): CHOL, HDL, LDLCALC, TRIG, CHOLHDL, LDLDIRECT in the last 72 hours. Thyroid Function Tests: No results for input(s): TSH,  T4TOTAL, FREET4, T3FREE, THYROIDAB in the last 72 hours. Anemia Panel: No results for input(s): VITAMINB12, FOLATE, FERRITIN, TIBC, IRON, RETICCTPCT in the last 72 hours. Sepsis Labs: No results for input(s): PROCALCITON, LATICACIDVEN in the last 168 hours.  No results found for this or any previous visit (from the past 240 hours).       Radiology Studies: DG Chest Portable 1 View Result Date: 01/01/2024 EXAM: 1 VIEW XRAY OF THE CHEST 01/01/2024 05:29:00 PM COMPARISON: 04/08/2023 CLINICAL HISTORY: Emesis, chest pain, acid reflux, globus sensation, and abdominal pain. New onset vomiting. FINDINGS: LUNGS AND PLEURA: No focal pulmonary opacity. No pulmonary edema. No pleural effusion. No pneumothorax. HEART AND MEDIASTINUM: No acute abnormality of the cardiac and mediastinal silhouettes. BONES AND SOFT TISSUES: Mid to lower thoracic spondylosis. IMPRESSION: 1. No acute cardiopulmonary process. 2. Mid to lower thoracic spondylosis. Electronically signed by: Ryan Salvage MD 01/01/2024 05:57 PM EDT RP Workstation: [id number]   CT ABDOMEN PELVIS W CONTRAST Result Date: 01/01/2024 CLINICAL DATA:  Epigastric and right upper quadrant pain. EXAM: CT ABDOMEN AND PELVIS WITH CONTRAST TECHNIQUE: Multidetector CT imaging of the abdomen and pelvis was performed using the standard protocol  following bolus administration of intravenous contrast. RADIATION DOSE REDUCTION: This exam was performed according to the departmental dose-optimization program which includes automated exposure control, adjustment of the mA and/or kV according to patient size and/or use of iterative reconstruction technique. CONTRAST:  OMNIPAQUE  IOHEXOL  300 MG/ML  SOLN COMPARISON:  04/08/2023 FINDINGS: Lower chest: Mild distal esophageal wall thickening. No basilar airspace disease or pleural effusion. Hepatobiliary: Unremarkable appearance of the liver. Gallbladder physiologically distended, no calcified  stone. No pericholecystic inflammation. No biliary dilatation. Pancreas: Unremarkable. No pancreatic ductal dilatation or surrounding inflammatory changes. Spleen: Normal in size without focal abnormality. Adrenals/Urinary Tract: No adrenal nodule. No hydronephrosis or perinephric edema. Homogeneous renal enhancement with symmetric excretion on delayed phase imaging. Urinary bladder is physiologically distended without wall thickening. Stomach/Bowel: Distal esophageal wall thickening. The stomach is unremarkable. No small bowel obstruction or inflammatory change. The appendix is normal. Small-moderate volume of stool throughout the colon. Vascular/Lymphatic: No acute vascular findings. Normal caliber abdominal aorta. Mild atherosclerosis. The portal, splenic, and mesenteric veins are patent. No abdominopelvic adenopathy. Reproductive: Prostate is unremarkable. Other: No free air, free fluid, or intra-abdominal fluid collection. Tiny fat containing umbilical hernia. Musculoskeletal: Minor spondylosis with anterior spurring. There are no acute or suspicious osseous abnormalities. IMPRESSION: 1. Distal esophageal wall thickening, can be seen with reflux or esophagitis. 2. No other acute abnormality in the abdomen/pelvis. Aortic Atherosclerosis (ICD10-I70.0). Electronically Signed   By: Andrea Gasman M.D.   On: 01/01/2024 16:48        Scheduled Meds:  insulin aspart  0-5 Units Subcutaneous QHS   insulin aspart  0-9 Units Subcutaneous TID WC   pantoprazole  (PROTONIX ) IV  40 mg Intravenous Q12H   Continuous Infusions:  lactated ringers 100 mL/hr at 01/02/24 0840     LOS: 0 days    Time spent: 52 minutes spent on 01/02/2024 caring for this patient face-to-face including chart review, ordering labs/tests, documenting, discussion with nursing staff, consultants, updating family and interview/physical exam    Camellia PARAS Pratt Barfield, DO [facility] Available via Epic secure chat 7am-7pm After these  hours, please refer to coverage provider listed on [email or web address] 01/02/2024, 11:26 AM

## 2024-01-02 NOTE — Plan of Care (Signed)
  Problem: Coping: Goal: Ability to adjust to condition or change in health will improve Outcome: Progressing   Problem: Fluid Volume: Goal: Ability to maintain a balanced intake and output will improve Outcome: Progressing   Problem: Health Behavior/Discharge Planning: Goal: Ability to manage health-related needs will improve Outcome: Progressing   Problem: Metabolic: Goal: Ability to maintain appropriate glucose levels will improve Outcome: Progressing

## 2024-01-02 NOTE — Anesthesia Preprocedure Evaluation (Addendum)
 Anesthesia Evaluation  Patient identified by MRN, date of birth, ID band Patient awake    Reviewed: Allergy & Precautions, NPO status , Patient's Chart, lab work & pertinent test results  History of Anesthesia Complications Negative for: history of anesthetic complications  Airway Mallampati: I  TM Distance: >3 FB Neck ROM: Full    Dental  (+) Teeth Intact, Dental Advisory Given   Pulmonary neg COPD, neg recent URI, former smoker   breath sounds clear to auscultation       Cardiovascular (-) hypertension(-) CAD  Rhythm:Regular Rate:Normal     Neuro/Psych  PSYCHIATRIC DISORDERS Anxiety Depression       GI/Hepatic PUD,GERD  ,,(+)     substance abuse  alcohol use  Endo/Other  diabetes, Type 2    Renal/GU      Musculoskeletal  (+) Arthritis ,    Abdominal   Peds  Hematology   Anesthesia Other Findings L Eye Blindness  Reproductive/Obstetrics                              Anesthesia Physical Anesthesia Plan  ASA: 3  Anesthesia Plan: MAC   Post-op Pain Management:    Induction: Intravenous  PONV Risk Score and Plan: 1 and Ondansetron , Propofol infusion and Treatment may vary due to age or medical condition  Airway Management Planned: Simple Face Mask  Additional Equipment:   Intra-op Plan:   Post-operative Plan: Extubation in OR  Informed Consent:      Dental advisory given  Plan Discussed with: CRNA and Surgeon  Anesthesia Plan Comments:          Anesthesia Quick Evaluation

## 2024-01-03 ENCOUNTER — Encounter (HOSPITAL_COMMUNITY): Payer: Self-pay | Admitting: Gastroenterology

## 2024-01-03 DIAGNOSIS — K922 Gastrointestinal hemorrhage, unspecified: Secondary | ICD-10-CM | POA: Diagnosis not present

## 2024-01-03 LAB — CBC
HCT: 40.1 % (ref 39.0–52.0)
Hemoglobin: 13.5 g/dL (ref 13.0–17.0)
MCH: 30.4 pg (ref 26.0–34.0)
MCHC: 33.7 g/dL (ref 30.0–36.0)
MCV: 90.3 fL (ref 80.0–100.0)
Platelets: 144 K/uL — ABNORMAL LOW (ref 150–400)
RBC: 4.44 MIL/uL (ref 4.22–5.81)
RDW: 12.5 % (ref 11.5–15.5)
WBC: 8 K/uL (ref 4.0–10.5)
nRBC: 0 % (ref 0.0–0.2)

## 2024-01-03 LAB — GLUCOSE, CAPILLARY
Glucose-Capillary: 173 mg/dL — ABNORMAL HIGH (ref 70–99)
Glucose-Capillary: 79 mg/dL (ref 70–99)
Glucose-Capillary: 105 mg/dL — ABNORMAL HIGH (ref 70–99)

## 2024-01-03 MED ORDER — PANTOPRAZOLE SODIUM 40 MG PO TBEC
40.0000 mg | DELAYED_RELEASE_TABLET | Freq: Two times a day (BID) | ORAL | Status: DC
  Administered 2024-01-03: 40 mg via ORAL
  Filled 2024-01-03: qty 1

## 2024-01-03 MED ORDER — PANTOPRAZOLE SODIUM 40 MG PO TBEC: 40.0000 mg | DELAYED_RELEASE_TABLET | Freq: Two times a day (BID) | ORAL | 2 refills | Status: AC

## 2024-01-03 NOTE — Consult Note (Signed)
 WOC Nurse Consult Note: Reason for Consult: scabs L shin  Wound type: healing full thickness wounds covered with brown dry tissue likely r/t trauma  Pressure Injury POA: NA  Measurement: see nursing flowsheet  Wound bed: dry brown  Drainage (amount, consistency, odor) dry  Periwound: intact  Dressing procedure/placement/frequency:  Paint scattered scabs to L left lower leg with Betadine daily, allow to air dry. May leave open to air.    POC discussed with bedside nurse. WOC team will not follow. Re-consult if further needs arise.   Thank you,    Powell Bar MSN, RN-BC, [employer]

## 2024-01-03 NOTE — Discharge Summary (Signed)
 Physician Discharge Summary   Patient: James Pratt MRN: [id number] DOB: 1970/10/20  Admit date:     01/01/2024  Discharge date: 01/03/24  Discharge Physician: Burnard DELENA Cunning   PCP: [facility]   Recommendations at discharge:    Follow up with PCP in 1-2 weeks Repeat CBC, CMP at follow up Follow up with GI in 6-8 weeks Follow up on pathology results from EGD 10/28  Discharge Diagnoses: Principal Problem:   Acute upper GI bleed Active Problems:   Controlled type 2 diabetes mellitus without complication, without long-term current use of insulin (HCC)   TOBACCO USER   Cannabis abuse   Hypercholesteremia   Anxiety   GERD (gastroesophageal reflux disease)  Resolved Problems:   * No resolved hospital problems. *  Hospital Course:  Munachimso Orel Cooler is a 53 y.o. male with past medical history significant for DM2, history of EtOH use disorder now in remission, left eye blindness, HLD, depression, GERD who presented to [facility] on 01/01/2024 with nausea and vomiting with reported hematemesis x 2.  Patient reports coffee-ground emesis noted by family.  Patient reports occasional use of NSAIDs, but not excessive.  Denies any active alcohol use.  Denies any anticoagulant/antiplatelet use.   In the ED, temperature 98.0 F, HR 80, RR 16, BP 123/83, SpO2 97% on room air.  WBC 10.3, hemoglobin 14.3, platelet count 166.  Sodium 140, potassium 4.0, chloride 99, CO2 28, glucose 122, BUN 18, creatinine 0.91.  Lipase 25.  AST 37, ALT 21, total bilirubin 0.5.  High since he troponin less than 15.  Chest x-ray with no acute cardiopulmonary process.  CT abdomen/pelvis with contrast with distal esophageal wall thickening consistent with reflux or esophagitis, no other acute abnormality in the abdomen/pelvis.  [facility] was consulted and recommended transfer to [facility] under the hospitalist service for further  evaluation and management and potential EGD.  GI was consulted.  Pt had EGD on 10/29 showing reflux esophagitis and gastritis that were biopsied.    Diet was resumed and pt tolerating well, without issues or acute complaints today.  Patient is medically stable, clinically improved and requesting for discharge home today.  Assessment and Plan:  Hematemesis with concern for UGIB Esophageal wall thickening Patient presenting to ED with nausea, vomiting and hematemesis x 2.  Previous history of alcohol use disorder, now remission.  Also endorses occasional NSAID use, but not excessive.  Not on anticoagulant/antiplatelet.  Hemoglobin 14.3 on admission.  CT abdomen/pelvis with distal esophageal wall thickening consistent with reflux versus esophagitis without any further abnormality in the abdomen/pelvis. -- [facility] following, appreciate assistance -- Hgb 14.3>13.5>13.4>13.5 stable -- Tread with Protonix  40 mg IV BID, IV fluids -- Discharge on PO Protonix  40 mgBID   DM2 Hemoglobin A1c 6.5%.  Diet controlled.   No longer taking any medication. Covered with sliding scale insulin  PCP follow up   HLD Currently not taking any medication   Depression Currently not taking any medication   GERD -- Protonix  IV as above   Left eye blindness Supportive care   History of EtOH use disorder, now in remission Continue to encourage complete abstinence/cessation.       Consultants: GI Procedures performed: EGD   Disposition: Home  Diet recommendation:  Soft carb controlled diet   DISCHARGE MEDICATION: Allergies as of 01/03/2024       Reactions   Ezetimibe Other (See Comments)   Meloxicam  Tinitus  Medication List     STOP taking these medications    benzonatate  100 MG capsule Commonly known as: TESSALON    cetirizine -pseudoephedrine  5-120 MG tablet Commonly known as: ZYRTEC -D   dicyclomine  20 MG tablet Commonly known as: BENTYL    docusate sodium  100 MG  capsule Commonly known as: COLACE   glipiZIDE  5 MG tablet Commonly known as: GLUCOTROL    ibuprofen  200 MG tablet Commonly known as: ADVIL    ipratropium 0.06 % nasal spray Commonly known as: Atrovent    metFORMIN  500 MG tablet Commonly known as: GLUCOPHAGE    methocarbamol  500 MG tablet Commonly known as: ROBAXIN    ondansetron  4 MG tablet Commonly known as: ZOFRAN    polyethylene glycol 17 g packet Commonly known as: MIRALAX  / GLYCOLAX    sildenafil  25 MG tablet Commonly known as: Viagra    triamcinolone  55 MCG/ACT Aero nasal inhaler Commonly known as: NASACORT        TAKE these medications    acetaminophen  500 MG tablet Commonly known as: TYLENOL  Take 1,000 mg by mouth every 6 (six) hours as needed for moderate pain.   glucose blood test strip Commonly known as: ONE TOUCH ULTRA TEST Dispense QS for twice daily testing   pantoprazole  40 MG tablet Commonly known as: PROTONIX  Take 1 tablet (40 mg total) by mouth 2 (two) times daily. What changed:  medication strength how much to take when to take this   Synjardy XR 12.07-998 MG Tb24 Generic drug: Empagliflozin -metFORMIN  HCl ER Take 1 tablet by mouth daily.        Discharge Exam: Filed Weights   01/01/24 2207  Weight: 69.9 kg   General exam: awake, alert, no acute distress HEENT: moist mucus membranes, hearing grossly normal  Respiratory system: CTAB, no wheezes, rales or rhonchi, normal respiratory effort. Cardiovascular system: normal S1/S2, RRR, no JVD, murmurs, rubs, gallops,  no pedal edema.   Gastrointestinal system: soft, NT, ND, no HSM felt, +bowel sounds. Central nervous system: A&O x3. no gross focal neurologic deficits, normal speech Extremities: moves all, no edema, normal tone Skin: dry, intact, normal temperature, normal color, No rashes, lesions or ulcers Psychiatry: normal mood, congruent affect, judgement and insight appear normal'  Condition at discharge: stable  The results of  significant diagnostics from this hospitalization (including imaging, microbiology, ancillary and laboratory) are listed below for reference.   Imaging Studies: DG Chest Portable 1 View Result Date: 01/01/2024 EXAM: 1 VIEW XRAY OF THE CHEST 01/01/2024 05:29:00 PM COMPARISON: 04/08/2023 CLINICAL HISTORY: Emesis, chest pain, acid reflux, globus sensation, and abdominal pain. New onset vomiting. FINDINGS: LUNGS AND PLEURA: No focal pulmonary opacity. No pulmonary edema. No pleural effusion. No pneumothorax. HEART AND MEDIASTINUM: No acute abnormality of the cardiac and mediastinal silhouettes. BONES AND SOFT TISSUES: Mid to lower thoracic spondylosis. IMPRESSION: 1. No acute cardiopulmonary process. 2. Mid to lower thoracic spondylosis. Electronically signed by: Ryan Salvage MD 01/01/2024 05:57 PM EDT RP Workstation: [id number]   CT ABDOMEN PELVIS W CONTRAST Result Date: 01/01/2024 CLINICAL DATA:  Epigastric and right upper quadrant pain. EXAM: CT ABDOMEN AND PELVIS WITH CONTRAST TECHNIQUE: Multidetector CT imaging of the abdomen and pelvis was performed using the standard protocol following bolus administration of intravenous contrast. RADIATION DOSE REDUCTION: This exam was performed according to the departmental dose-optimization program which includes automated exposure control, adjustment of the mA and/or kV according to patient size and/or use of iterative reconstruction technique. CONTRAST:  OMNIPAQUE  IOHEXOL  300 MG/ML  SOLN COMPARISON:  04/08/2023 FINDINGS: Lower chest: Mild distal esophageal wall thickening. No basilar airspace  disease or pleural effusion. Hepatobiliary: Unremarkable appearance of the liver. Gallbladder physiologically distended, no calcified stone. No pericholecystic inflammation. No biliary dilatation. Pancreas: Unremarkable. No pancreatic ductal dilatation or surrounding inflammatory changes. Spleen: Normal in size without focal abnormality. Adrenals/Urinary Tract: No  adrenal nodule. No hydronephrosis or perinephric edema. Homogeneous renal enhancement with symmetric excretion on delayed phase imaging. Urinary bladder is physiologically distended without wall thickening. Stomach/Bowel: Distal esophageal wall thickening. The stomach is unremarkable. No small bowel obstruction or inflammatory change. The appendix is normal. Small-moderate volume of stool throughout the colon. Vascular/Lymphatic: No acute vascular findings. Normal caliber abdominal aorta. Mild atherosclerosis. The portal, splenic, and mesenteric veins are patent. No abdominopelvic adenopathy. Reproductive: Prostate is unremarkable. Other: No free air, free fluid, or intra-abdominal fluid collection. Tiny fat containing umbilical hernia. Musculoskeletal: Minor spondylosis with anterior spurring. There are no acute or suspicious osseous abnormalities. IMPRESSION: 1. Distal esophageal wall thickening, can be seen with reflux or esophagitis. 2. No other acute abnormality in the abdomen/pelvis. Aortic Atherosclerosis (ICD10-I70.0). Electronically Signed   By: Andrea Gasman M.D.   On: 01/01/2024 16:48    Microbiology: Results for orders placed or performed during the hospital encounter of 09/21/23  Group A Strep by PCR if patient complains of sore throat.     Status: None   Collection Time: 09/21/23 12:24 PM   Specimen: Throat; Sterile Swab  Result Value Ref Range Status   Group A Strep by PCR NOT DETECTED NOT DETECTED Final    Comment: Performed at [facility], [address] [place], [place] [address]  Resp panel by RT-PCR (RSV, Flu A&B, Covid) Anterior Nasal Swab     Status: None   Collection Time: 09/21/23 12:24 PM   Specimen: Anterior Nasal Swab  Result Value Ref Range Status   SARS Coronavirus 2 by RT PCR NEGATIVE NEGATIVE Final    Comment: (NOTE) SARS-CoV-2 target nucleic acids are NOT DETECTED.  The SARS-CoV-2 RNA is generally detectable in upper respiratory specimens  during the acute phase of infection. The lowest concentration of SARS-CoV-2 viral copies this assay can detect is 138 copies/mL. A negative result does not preclude SARS-Cov-2 infection and should not be used as the sole basis for treatment or other patient management decisions. A negative result may occur with  improper specimen collection/handling, submission of specimen other than nasopharyngeal swab, presence of viral mutation(s) within the areas targeted by this assay, and inadequate number of viral copies(<138 copies/mL). A negative result must be combined with clinical observations, patient history, and epidemiological information. The expected result is Negative.  Fact Sheet for Patients:  bloggercourse.com  Fact Sheet for Healthcare Providers:  seriousbroker.it  This test is no t yet approved or cleared by the [place]  FDA and  has been authorized for detection and/or diagnosis of SARS-CoV-2 by FDA under an Emergency Use Authorization (EUA). This EUA will remain  in effect (meaning this test can be used) for the duration of the COVID-19 declaration under Section 564(b)(1) of the Act, 21 U.S.C.section 360bbb-3(b)(1), unless the authorization is terminated  or revoked sooner.       Influenza A by PCR NEGATIVE NEGATIVE Final   Influenza B by PCR NEGATIVE NEGATIVE Final    Comment: (NOTE) The Xpert Xpress SARS-CoV-2/FLU/RSV plus assay is intended as an aid in the diagnosis of influenza from Nasopharyngeal swab specimens and should not be used as a sole basis for treatment. Nasal washings and aspirates are unacceptable for Xpert Xpress SARS-CoV-2/FLU/RSV testing.  Fact Sheet for Patients:  bloggercourse.com  Fact Sheet for Healthcare Providers: seriousbroker.it  This test is not yet approved or cleared by the [place]  FDA and has been authorized for detection  and/or diagnosis of SARS-CoV-2 by FDA under an Emergency Use Authorization (EUA). This EUA will remain in effect (meaning this test can be used) for the duration of the COVID-19 declaration under Section 564(b)(1) of the Act, 21 U.S.C. section 360bbb-3(b)(1), unless the authorization is terminated or revoked.     Resp Syncytial Virus by PCR NEGATIVE NEGATIVE Final    Comment: (NOTE) Fact Sheet for Patients: bloggercourse.com  Fact Sheet for Healthcare Providers: seriousbroker.it  This test is not yet approved or cleared by the [place]  FDA and has been authorized for detection and/or diagnosis of SARS-CoV-2 by FDA under an Emergency Use Authorization (EUA). This EUA will remain in effect (meaning this test can be used) for the duration of the COVID-19 declaration under Section 564(b)(1) of the Act, 21 U.S.C. section 360bbb-3(b)(1), unless the authorization is terminated or revoked.  Performed at [facility], [address] [place], [place] [address]     Labs: CBC: Recent Labs  Lab 01/02/24 [id number] 01/02/24 1054 01/02/24 1729 01/02/24 2319 01/03/24 0421  WBC 5.2 5.0 4.5 6.4 8.0  HGB 13.4 13.8 13.4 13.1 13.5  HCT 40.3 40.7 40.5 39.8 40.1  MCV 90.4 90.2 91.8 90.2 90.3  PLT 153 153 143* 150 144*   Basic Metabolic Panel: Recent Labs  Lab 01/01/24 1418 01/02/24 0412  NA 140 140  K 4.0 3.9  CL 99 105  CO2 28 25  GLUCOSE 122* 85  BUN 18 17  CREATININE 0.91 0.90  CALCIUM  10.5* 9.2   Liver Function Tests: Recent Labs  Lab 01/01/24 1418 01/02/24 0412  AST 37 25  ALT 21 15  ALKPHOS 65 51  BILITOT 0.5 0.6  PROT 8.0 6.5  ALBUMIN 5.0 4.1   CBG: Recent Labs  Lab 01/02/24 1617 01/02/24 2040 01/03/24 0100 01/03/24 0404 01/03/24 0723  GLUCAP 72 223* 173* 79 105*    Discharge time spent: less than 30 minutes.  Signed: Burnard DELENA Cunning, DO [facility] 01/03/2024

## 2024-01-05 LAB — SURGICAL PATHOLOGY

## 2024-01-07 ENCOUNTER — Ambulatory Visit: Payer: Self-pay | Admitting: Gastroenterology

## 2024-01-10 ENCOUNTER — Telehealth: Payer: Self-pay

## 2024-01-10 NOTE — Telephone Encounter (Signed)
 Per 01/02/24 procedure report - follow up in GI clinic in 6-8 weeks  Patient has been scheduled for a follow up with Deanna May, NP on Friday, 02/23/24 at 2 pm. Called and left patient a detailed vm with appt information. I informed patient that I have also mailed a copy of appt information. Advised patient to call the office at his convenience if he needs to reschedule appointment.

## 2024-02-23 ENCOUNTER — Other Ambulatory Visit

## 2024-02-23 ENCOUNTER — Encounter: Payer: Self-pay | Admitting: Gastroenterology

## 2024-02-23 ENCOUNTER — Ambulatory Visit: Payer: Self-pay | Admitting: Gastroenterology

## 2024-02-23 ENCOUNTER — Ambulatory Visit: Admitting: Gastroenterology

## 2024-02-23 VITALS — BP 110/70 | HR 80 | Ht 68.0 in | Wt 161.0 lb

## 2024-02-23 DIAGNOSIS — K2100 Gastro-esophageal reflux disease with esophagitis, without bleeding: Secondary | ICD-10-CM | POA: Diagnosis not present

## 2024-02-23 DIAGNOSIS — D696 Thrombocytopenia, unspecified: Secondary | ICD-10-CM | POA: Diagnosis not present

## 2024-02-23 DIAGNOSIS — K920 Hematemesis: Secondary | ICD-10-CM | POA: Diagnosis not present

## 2024-02-23 LAB — CBC WITH DIFFERENTIAL/PLATELET
Basophils Absolute: 0 K/uL (ref 0.0–0.1)
Basophils Relative: 0.6 % (ref 0.0–3.0)
Eosinophils Absolute: 0.1 K/uL (ref 0.0–0.7)
Eosinophils Relative: 2.4 % (ref 0.0–5.0)
HCT: 41.1 % (ref 39.0–52.0)
Hemoglobin: 13.8 g/dL (ref 13.0–17.0)
Lymphocytes Relative: 33.8 % (ref 12.0–46.0)
Lymphs Abs: 2 K/uL (ref 0.7–4.0)
MCHC: 33.7 g/dL (ref 30.0–36.0)
MCV: 88.5 fl (ref 78.0–100.0)
Monocytes Absolute: 0.5 K/uL (ref 0.1–1.0)
Monocytes Relative: 7.7 % (ref 3.0–12.0)
Neutro Abs: 3.3 K/uL (ref 1.4–7.7)
Neutrophils Relative %: 55.5 % (ref 43.0–77.0)
Platelets: 153 K/uL (ref 150.0–400.0)
RBC: 4.64 Mil/uL (ref 4.22–5.81)
RDW: 13.3 % (ref 11.5–15.5)
WBC: 5.9 K/uL (ref 4.0–10.5)

## 2024-02-23 NOTE — Progress Notes (Signed)
 "  Chief Complaint:hospital follow-up Primary GI Doctor:(previously Dr. Teressa) Dr. Charlanne  HPI:  James Pratt is a 53 y.o. male with past medical history significant for remote history of alcohol use (no longer drinking), left eye blindness, diabetes, chronic GERD, hyperlipidemia, presents for hospital follow-up.  01/02/24 GI consulted at hospital for evaluation of coffee-ground emesis.  CT abdomen pelvis with contrast significant for distal esophageal wall thickening concerning for reflux/esophagitis, otherwise unrevealing. HD stable. Hb stable. GERD. No dysphagia H/O remote EtOH use.  No cirrhosis on CT Normal CBC/CMP without elevated liver enzymes or leukocytosis.   Interval History Patient presents for follow-up after recent hospitalization for coffee ground emesis and GERD with esophagitis grade C.  Patient reports he has been taking PPI therapy twice daily. Reports he is no longer having GERD symptoms. No dysphagia. No hematemesis No blood in stool.  He has occasional constipation. He does strain sometimes. He does note some irritation at rectum sometimes after bowel movement. Describes it as a burning sensation.   He does complain of left hip pain and lower back pain.   No alcohol use. He stopped smoking mariajuana.    Patient's family history includes:  no colon CA, no esophageal CA  PREVIOUS GI WORKUP    01/02/24 EGD:LA Grade C reflux esophagitis with no bleeding. Biopsied. Gastritis. Biopsied. Normal examined duodenum.   Screening colonoscopy in 2021 - Normal - Repeat 10 years  Wt Readings from Last 3 Encounters:  02/23/24 161 lb (73 kg)  01/01/24 154 lb 1.6 oz (69.9 kg)  09/21/23 165 lb (74.8 kg)     Past Medical History:  Diagnosis Date   Bilateral knee pain 12/29/2017   Blind left eye    Cataract    forming right eye    Contusion of toenail 05/08/2017   Depression    situational after death of brother    Diabetes mellitus without  complication (HCC)    GERD (gastroesophageal reflux disease)    past hx of GERD- denies currently   Hyperlipidemia    Orthostatic hypotension 10/03/2016    Past Surgical History:  Procedure Laterality Date   ESOPHAGOGASTRODUODENOSCOPY N/A 01/02/2024   Procedure: EGD (ESOPHAGOGASTRODUODENOSCOPY);  Surgeon: Charlanne Groom, MD;  Location: [facility];  Service: Gastroenterology;  Laterality: N/A;   EYE SURGERY Left     Current Outpatient Medications  Medication Sig Dispense Refill   acetaminophen  (TYLENOL ) 500 MG tablet Take 1,000 mg by mouth every 6 (six) hours as needed for moderate pain.     glucose blood (ONE TOUCH ULTRA TEST) test strip Dispense QS for twice daily testing 100 each 12   pantoprazole  (PROTONIX ) 40 MG tablet Take 1 tablet (40 mg total) by mouth 2 (two) times daily. 60 tablet 2   SYNJARDY XR 12.07-998 MG TB24 Take 1 tablet by mouth daily.     No current facility-administered medications for this visit.    Allergies as of 02/23/2024 - Review Complete 02/23/2024  Allergen Reaction Noted   Ezetimibe Other (See Comments) 11/23/2021   Meloxicam  Tinitus 10/06/2016    Family History  Problem Relation Age of Onset   Colon cancer Neg Hx    Colon polyps Neg Hx    Esophageal cancer Neg Hx    Rectal cancer Neg Hx    Stomach cancer Neg Hx     Review of Systems:    Constitutional: No weight loss, fever, chills, weakness or fatigue HEENT: Eyes: No change in vision  Ears, Nose, Throat:  No change in hearing or congestion Skin: No rash or itching Cardiovascular: No chest pain, chest pressure or palpitations   Respiratory: No SOB or cough Gastrointestinal: See HPI and otherwise negative Genitourinary: No dysuria or change in urinary frequency Neurological: No headache, dizziness or syncope Musculoskeletal: No new muscle or joint pain Hematologic: No bleeding or bruising Psychiatric: No history of depression or anxiety    Physical Exam:  Vital  signs: BP 110/70   Pulse 80   Ht 5' 8 (1.727 m)   Wt 161 lb (73 kg)   BMI 24.48 kg/m   Constitutional:   Pleasant male appears to be in NAD, Well developed, Well nourished, alert and cooperative Eyes:   PEERL, EOMI. No icterus. Conjunctiva pink. Neck:  Supple Throat: Oral cavity and pharynx without inflammation, swelling or lesion.  Respiratory: Respirations even and unlabored. Lungs clear to auscultation bilaterally.   No wheezes, crackles, or rhonchi.  Cardiovascular: Normal S1, S2. Regular rate and rhythm. No peripheral edema, cyanosis or pallor.  Gastrointestinal:  Soft, nondistended, nontender. No rebound or guarding. Normal bowel sounds. No appreciable masses or hepatomegaly. Rectal:  Not performed.  Msk:  Symmetrical without gross deformities. Without edema, no deformity or joint abnormality.  Neurologic:  Alert and  oriented x4;  grossly normal neurologically.  Skin:   Dry and intact without significant lesions or rashes.  RELEVANT LABS AND IMAGING: CBC    Latest Ref Rng & Units 01/03/2024    4:21 AM 01/02/2024   11:19 PM 01/02/2024    5:29 PM  CBC  WBC 4.0 - 10.5 K/uL 8.0  6.4  4.5   Hemoglobin 13.0 - 17.0 g/dL 86.4  86.8  86.5   Hematocrit 39.0 - 52.0 % 40.1  39.8  40.5   Platelets 150 - 400 K/uL 144  150  143      CMP     Latest Ref Rng & Units 01/02/2024    4:12 AM 01/01/2024    2:18 PM 04/08/2023   11:14 AM  CMP  Glucose 70 - 99 mg/dL 85  877  872   BUN 6 - 20 mg/dL 17  18  15    Creatinine 0.61 - 1.24 mg/dL 9.09  9.08  9.27   Sodium 135 - 145 mmol/L 140  140  138   Potassium 3.5 - 5.1 mmol/L 3.9  4.0  4.0   Chloride 98 - 111 mmol/L 105  99  105   CO2 22 - 32 mmol/L 25  28  25    Calcium  8.9 - 10.3 mg/dL 9.2  89.4  9.6   Total Protein 6.5 - 8.1 g/dL 6.5  8.0  7.4   Total Bilirubin 0.0 - 1.2 mg/dL 0.6  0.5  0.6   Alkaline Phos 38 - 126 U/L 51  65  61   AST 15 - 41 U/L 25  37  19   ALT 0 - 44 U/L 15  21  12     04/2019 echo- Left ventricular ejection  fraction, by estimation, is 65 to 70%.   01/01/24 CTAP IMPRESSION: 1. Distal esophageal wall thickening, can be seen with reflux or esophagitis. 2. No other acute abnormality in the abdomen/pelvis.    Assessment/Plan: Encounter Diagnoses  Name Primary?   Gastroesophageal reflux disease with esophagitis, unspecified whether hemorrhage Yes   Hematemesis, unspecified whether nausea present    Thrombocytopenia    Patient presents for follow-up ED visit where he had nausea, vomiting and hematemesis x 2. Previous  history of alcohol use disorder, now remission. Also endorses occasional NSAID use, but not excessive. Not on anticoagulant/antiplatelet. Hemoglobin 14.3 on admission. CT abdomen/pelvis with distal esophageal wall thickening consistent with reflux versus esophagitis without any further abnormality in the abdomen/pelvis.   #1 Hematemesis with concern for UGIB #2 Esophageal wall thickening #3 GERD  -Avoid ibuprofen , naproxen , or other non- steroidal anti- inflammatory drugs. -Protonix  40 mg p. o. twice daily x 6 weeks, then every day -recheck CBC  -Schedule follow-up EGD in [facility] with Dr. Charlanne. The risks and benefits of EGD with possible biopsies and esophageal dilation were discussed with the patient who agrees to proceed.  #4 Thrombocytopenia  #5 DM2   #6 Left eye blindness -Supportive care   #7 History of EtOH use disorder, now in remission -Continue to encourage complete abstinence/cessation.  #8 Left hip pain -Referral to Dr. [facility]   Thank you for the courtesy of this consult. Please call me with any questions or concerns.   Saphronia Ozdemir, FNP-C [facility] 02/23/2024, 2:27 PM  Cc: [facility]  "

## 2024-02-23 NOTE — Patient Instructions (Addendum)
 GERD Recommend GERD diet Continue Protonix  40 mg po daily  Avoid ibuprofen , naproxen , or other non- steroidal anti- inflammatory drugs  Constipation Recommend high fiber diet Drink lots of water  Can add OTC Metamucil or Benefiber    Hemorrhoids , rectal discomfort No straining or pushing No prolonged sitting in bathroom Apply ointment when irritation  Keep area clean and dry  Your provider has requested that you go to the basement level for lab work before leaving today. Press B on the elevator. The lab is located at the first door on the left as you exit the elevator.  You have been scheduled for an endoscopy. Please follow written instructions given to you at your visit today.  If you use inhalers (even only as needed), please bring them with you on the day of your procedure.  If you take any of the following medications, they will need to be adjusted prior to your procedure:   DO NOT TAKE 7 DAYS PRIOR TO TEST- Trulicity  (dulaglutide ) Ozempic, Wegovy (semaglutide) Mounjaro, Zepbound (tirzepatide) Bydureon Bcise (exanatide extended release)  DO NOT TAKE 1 DAY PRIOR TO YOUR TEST Rybelsus (semaglutide) Adlyxin (lixisenatide) Victoza (liraglutide) Byetta (exanatide) ___________________________________________________________________________  Due to recent changes in healthcare laws, you may see the results of your imaging and laboratory studies on MyChart before your provider has had a chance to review them.  We understand that in some cases there may be results that are confusing or concerning to you. Not all laboratory results come back in the same time frame and the provider may be waiting for multiple results in order to interpret others.  Please give us  48 hours in order for your provider to thoroughly review all the results before contacting the office for clarification of your results.   _______________________________________________________  If your blood pressure  at your visit was 140/90 or greater, please contact your primary care physician to follow up on this.  _______________________________________________________  If you are age 16 or older, your body mass index should be between 23-30. Your Body mass index is 24.48 kg/m. If this is out of the aforementioned range listed, please consider follow up with your Primary Care Provider.  If you are age 75 or younger, your body mass index should be between 19-25. Your Body mass index is 24.48 kg/m. If this is out of the aformentioned range listed, please consider follow up with your Primary Care Provider.   ________________________________________________________  The [facility] providers would like to encourage you to use MYCHART to communicate with providers for non-urgent requests or questions.  Due to long hold times on the telephone, sending your provider a message by [facility] may be a faster and more efficient way to get a response.  Please allow 48 business hours for a response.  Please remember that this is for non-urgent requests.  _______________________________________________________  [facility] is using a team-based approach to care.  Your team is made up of your doctor and two to three APPS. Our APPS (Nurse Practitioners and Physician Assistants) work with your physician to ensure care continuity for you. They are fully qualified to address your health concerns and develop a treatment plan. They communicate directly with your gastroenterologist to care for you. Seeing the Advanced Practice Practitioners on your physician's team can help you by facilitating care more promptly, often allowing for earlier appointments, access to diagnostic testing, procedures, and other specialty referrals.   Thank you for trusting me with your gastrointestinal care. Deanna May, FNP-C

## 2024-02-28 ENCOUNTER — Other Ambulatory Visit: Payer: Self-pay

## 2024-02-28 ENCOUNTER — Emergency Department (HOSPITAL_BASED_OUTPATIENT_CLINIC_OR_DEPARTMENT_OTHER): Admitting: Radiology

## 2024-02-28 ENCOUNTER — Emergency Department (HOSPITAL_BASED_OUTPATIENT_CLINIC_OR_DEPARTMENT_OTHER)
Admission: EM | Admit: 2024-02-28 | Discharge: 2024-02-28 | Disposition: A | Discharge status: Home / Self Care | Attending: Emergency Medicine | Admitting: Emergency Medicine

## 2024-02-28 DIAGNOSIS — F17200 Nicotine dependence, unspecified, uncomplicated: Secondary | ICD-10-CM | POA: Diagnosis not present

## 2024-02-28 DIAGNOSIS — E119 Type 2 diabetes mellitus without complications: Secondary | ICD-10-CM | POA: Insufficient documentation

## 2024-02-28 DIAGNOSIS — R079 Chest pain, unspecified: Secondary | ICD-10-CM | POA: Diagnosis present

## 2024-02-28 LAB — BASIC METABOLIC PANEL WITH GFR
Anion gap: 11 (ref 5–15)
BUN: 17 mg/dL (ref 6–20)
CO2: 24 mmol/L (ref 22–32)
Calcium: 9.7 mg/dL (ref 8.9–10.3)
Chloride: 102 mmol/L (ref 98–111)
Creatinine, Ser: 1.23 mg/dL (ref 0.61–1.24)
GFR, Estimated: >60 mL/min
Glucose, Bld: 288 mg/dL — ABNORMAL HIGH (ref 70–99)
Potassium: 4.3 mmol/L (ref 3.5–5.1)
Sodium: 137 mmol/L (ref 135–145)

## 2024-02-28 LAB — CBC
HCT: 39.4 % (ref 39.0–52.0)
Hemoglobin: 13.7 g/dL (ref 13.0–17.0)
MCH: 30.3 pg (ref 26.0–34.0)
MCHC: 34.8 g/dL (ref 30.0–36.0)
MCV: 87.2 fL (ref 80.0–100.0)
Platelets: 158 K/uL (ref 150–400)
RBC: 4.52 MIL/uL (ref 4.22–5.81)
RDW: 12.9 % (ref 11.5–15.5)
WBC: 5.4 K/uL (ref 4.0–10.5)
nRBC: 0 % (ref 0.0–0.2)

## 2024-02-28 LAB — TROPONIN T, HIGH SENSITIVITY: Troponin T High Sensitivity: <15 ng/L (ref 0–19)

## 2024-02-28 NOTE — ED Triage Notes (Incomplete)
 Pt caox4 c/o CP that has been ongoing since being in the hospital in Oct but that the pain worsened today. Pt states it is typically relieved with burping but is not today. Pt also c/o headache today.

## 2024-02-28 NOTE — ED Provider Notes (Signed)
 " [facility] AT [facility] Provider Note   CSN: [id number] Arrival date & time: 02/28/24  1028     Patient presents with: Chest Pain   James Pratt is a 53 y.o. male.   HPI 53 year old male history of type 2 diabetes, tobacco use, cannabis use, hypercholesterolemia, anxiety, GERD, GI bleed presents today complaining of chest pain.  He reports that he has had chest pain for several months.  It has been constant in nature.  It increased last night.  He describes it as across the lower left chest and worse with movement and palpation.  He denies any dyspnea, fever, pain radiating to his shoulder or back    Prior to Admission medications  Medication Sig Start Date End Date Taking? Authorizing Provider  acetaminophen  (TYLENOL ) 500 MG tablet Take 1,000 mg by mouth every 6 (six) hours as needed for moderate pain.    [provider]  glucose blood (ONE TOUCH ULTRA TEST) test strip Dispense QS for twice daily testing 02/22/19   Jarrett Lucie SAILOR, DO  pantoprazole  (PROTONIX ) 40 MG tablet Take 1 tablet (40 mg total) by mouth 2 (two) times daily. 01/03/24   Fausto Sor A, DO  SYNJARDY XR 12.07-998 MG TB24 Take 1 tablet by mouth daily. 06/04/21   [provider]    Allergies: Ezetimibe and Meloxicam     Review of Systems  Updated Vital Signs BP 120/74   Pulse 67   Temp 98.2 F (36.8 C) (Oral)   Resp 17   Ht 1.727 m (5' 8)   Wt 74.4 kg   SpO2 98%   BMI 24.94 kg/m   Physical Exam Vitals reviewed.  Constitutional:      Appearance: He is well-developed.  HENT:     Head: Normocephalic.  Eyes:     Pupils: Pupils are equal, round, and reactive to light.  Cardiovascular:     Rate and Rhythm: Normal rate and regular rhythm.     Heart sounds: Normal heart sounds.     Comments: Tenderness to palpation left anterior chest Pulmonary:     Effort: Pulmonary effort is normal.     Breath sounds: Normal breath sounds.  Abdominal:      General: Bowel sounds are normal.     Palpations: Abdomen is soft.  Musculoskeletal:        General: Normal range of motion.     Cervical back: Normal range of motion.  Skin:    General: Skin is warm.     Capillary Refill: Capillary refill takes less than 2 seconds.  Neurological:     General: No focal deficit present.     Mental Status: He is alert.  Psychiatric:        Mood and Affect: Mood normal.     (all labs ordered are listed, but only abnormal results are displayed) Labs Reviewed  BASIC METABOLIC PANEL WITH GFR - Abnormal; Notable for the following components:      Result Value   Glucose, Bld 288 (*)    All other components within normal limits  CBC  TROPONIN T, HIGH SENSITIVITY  TROPONIN T, HIGH SENSITIVITY    EKG: EKG Interpretation Date/Time:  Wednesday February 28 2024 10:42:49 EST Ventricular Rate:  68 PR Interval:  169 QRS Duration:  89 QT Interval:  394 QTC Calculation: 419 R Axis:   88  Text Interpretation: Sinus rhythm Confirmed by Levander Houston [phone]) on 02/28/2024 11:22:59 AM  Radiology: [facility] Chest Port 1 View Result Date:  02/28/2024 EXAM: 1 VIEW(S) XRAY OF THE CHEST 02/28/2024 11:13:02 AM COMPARISON: 01/01/2024 CLINICAL HISTORY: Chest pain FINDINGS: LUNGS AND PLEURA: No focal pulmonary opacity. No pleural effusion. No pneumothorax. HEART AND MEDIASTINUM: No acute abnormality of the cardiac and mediastinal silhouettes. BONES AND SOFT TISSUES: No acute osseous abnormalities . IMPRESSION: 1. No acute cardiopulmonary abnormality. Electronically signed by: Michaeline Blanch MD 02/28/2024 11:43 AM EST RP Workstation: [id number]     Procedures   Medications Ordered in the ED - No data to display  Clinical Course as of 02/28/24 1212  Wed Feb 28, 2024  1212 Chest x-Carloyn Lahue reviewed interpreted and within normal limits [DR]    Clinical Course User Index [DR] Levander Houston, MD                                 Medical Decision Making Amount and/or  Complexity of Data Reviewed Labs: ordered. Radiology: ordered.   53 year old male presents today complaining of left sided chest pain that has been chronic in nature however has had some worsening today. Differential diagnosis includes was not limited to coronary artery disease, musculoskeletal etiology, aortic disease dissection and other diseases of the great vessel, intrathoracic etiology such as PE or pneumonia. Patient evaluated here with EKG without evidence of acute ischemia.  Patient was evaluated here with physical exam and has normal vital signs.  He does have pain that reproduces his pain on physical exam Heart enzymes obtained and are normal CBC and basic metabolic panel were obtained. Patient is hyperglycemic with known diabetes does not appear to be in DKA.  His troponin T is normal. Patient seen and evaluated for chest pain.  Differential diagnosis of serious/life threatening causes of chest pain includes ACS, other diseases of the heart such as myocarditis or pericarditis, lung etiologies such as infection or pneumothorax, diseases of the great vessels such as aortic dissection or AAA, pulmonary embolism, or GI sources such as cholecystitis or other upper abdominal causes. Doubt ACS- heart score documented, EKG reviewed, Given the timing of pain to ER presentation, single troponin normal so doubt NSTEMI troponin and repeat troponin obtained and WNL Doubt myocarditis/pericarditis/tamponade based on history, review of ekg and labs Doubt aortic dissection based on history and review of imaging Doubt intrinsic lung causes such as pneumonia or pneumothorax, based on history, physical exam, and studies obtained. Doubt PE based on history, physical exam, and PERC Doubt acute GI etiology requiring intervention based on history, physical exam and labs. Patient appears stable for discharge. Return precautions and need for follow up discussed and patient voices understanding      Final  diagnoses:  Chest pain, unspecified type    ED Discharge Orders     None          Levander Houston, MD 02/28/24 1213  "

## 2024-03-26 ENCOUNTER — Encounter: Admitting: Gastroenterology

## 2024-04-04 ENCOUNTER — Encounter: Payer: Self-pay | Admitting: Gastroenterology

## 2024-04-04 ENCOUNTER — Ambulatory Visit: Admitting: Gastroenterology

## 2024-04-04 VITALS — BP 107/68 | HR 56 | Temp 98.1°F | Resp 13 | Ht 68.0 in | Wt 161.0 lb

## 2024-04-04 DIAGNOSIS — K222 Esophageal obstruction: Secondary | ICD-10-CM

## 2024-04-04 DIAGNOSIS — K2950 Unspecified chronic gastritis without bleeding: Secondary | ICD-10-CM

## 2024-04-04 DIAGNOSIS — K31A19 Gastric intestinal metaplasia without dysplasia, unspecified site: Secondary | ICD-10-CM | POA: Diagnosis not present

## 2024-04-04 DIAGNOSIS — K2100 Gastro-esophageal reflux disease with esophagitis, without bleeding: Secondary | ICD-10-CM

## 2024-04-04 MED ORDER — SODIUM CHLORIDE 0.9 % IV SOLN: 500.0000 mL | Freq: Once | INTRAVENOUS | Status: DC

## 2024-04-04 NOTE — Patient Instructions (Addendum)
 YOU HAD AN ENDOSCOPIC PROCEDURE TODAY AT THE [facility]:   Refer to the procedure report that was given to you for any specific questions about what was found during the examination.  If the procedure report does not answer your questions, please call your gastroenterologist to clarify.  If you requested that your care partner not be given the details of your procedure findings, then the procedure report has been included in a sealed envelope for you to review at your convenience later.  YOU SHOULD EXPECT: Some feelings of bloating in the abdomen. Passage of more gas than usual.  Walking can help get rid of the air that was put into your GI tract during the procedure and reduce the bloating. If you had a lower endoscopy (such as a colonoscopy or flexible sigmoidoscopy) you may notice spotting of blood in your stool or on the toilet paper. If you underwent a bowel prep for your procedure, you may not have a normal bowel movement for a few days.  Please Note:  You might notice some irritation and congestion in your nose or some drainage.  This is from the oxygen  used during your procedure.  There is no need for concern and it should clear up in a day or so.  SYMPTOMS TO REPORT IMMEDIATELY:  Following upper endoscopy (EGD)  Vomiting of blood or coffee ground material  New chest pain or pain under the shoulder blades  Painful or persistently difficult swallowing  New shortness of breath  Fever of 100F or higher  Black, tarry-looking stools  Post dilation diet Continue Protonix  - 40 mg by mouth daily Await pathology results  Avoid non steroidals/alcohol Follow up for any problems    For urgent or emergent issues, a gastroenterologist can be reached at any hour by calling [phone]. Do not use MyChart messaging for urgent concerns.    DIET:  We do recommend a small meal at first, but then you may proceed to your regular diet.  Drink plenty of fluids but you should avoid  alcoholic beverages for 24 hours.  ACTIVITY:  You should plan to take it easy for the rest of today and you should NOT DRIVE or use heavy machinery until tomorrow (because of the sedation medicines used during the test).    FOLLOW UP: Our staff will call the number listed on your records the next business day following your procedure.  We will call around 7:15- 8:00 am to check on you and address any questions or concerns that you may have regarding the information given to you following your procedure. If we do not reach you, we will leave a message.     If any biopsies were taken you will be contacted by phone or by letter within the next 1-3 weeks.  Please call us  at [phone] if you have not heard about the biopsies in 3 weeks.    SIGNATURES/CONFIDENTIALITY: You and/or your care partner have signed paperwork which will be entered into your electronic medical record.  These signatures attest to the fact that that the information above on your After Visit Summary has been reviewed and is understood.  Full responsibility of the confidentiality of this discharge information lies with you and/or your care-partner.

## 2024-04-04 NOTE — Progress Notes (Signed)
0931 Robinul 0.1 mg IV given due large amount of secretions upon assessment.  MD made aware, vss

## 2024-04-04 NOTE — Progress Notes (Signed)
 Report given to PACU, vss

## 2024-04-04 NOTE — Op Note (Signed)
 [facility] Patient Name: James Pratt Procedure Date: 04/04/2024 9:33 AM MRN: [id number] Endoscopist: Lynnie Bring , MD, [id number] Age: 54 Referring MD:  Date of Birth: [birth year] Gender: Male Account #: [id number] Procedure:                Upper GI endoscopy Indications:              Dysphagia. Previous history of esophagitis. Also                            previous biopsies showing gastric intestinal                            metaplasia. Medicines:                Monitored Anesthesia Care Procedure:                Pre-Anesthesia Assessment:                           - Prior to the procedure, a History and Physical                            was performed, and patient medications and                            allergies were reviewed. The patient's tolerance of                            previous anesthesia was also reviewed. The risks                            and benefits of the procedure and the sedation                            options and risks were discussed with the patient.                            All questions were answered, and informed consent                            was obtained. Prior Anticoagulants: The patient has                            taken no anticoagulant or antiplatelet agents. ASA                            Grade Assessment: II - A patient with mild systemic                            disease. After reviewing the risks and benefits,                            the patient was deemed in satisfactory condition to  undergo the procedure.                           After obtaining informed consent, the endoscope was                            passed under direct vision. Throughout the                            procedure, the patient's blood pressure, pulse, and                            oxygen  saturations were monitored continuously. The                            GIF [id number] #[id number] was introduced through the                             mouth, and advanced to the second part of duodenum.                            The upper GI endoscopy was accomplished without                            difficulty. The patient tolerated the procedure                            well. Scope In: Scope Out: Findings:                 One benign-appearing, intrinsic mild stenosis was                            found 38 cm from the incisors. This stenosis                            measured 1.4 cm (inner diameter). The stenosis was                            traversed. The scope was withdrawn. Dilation was                            performed with a Maloney dilator with mild                            resistance at 54 Fr. The esophagitis had completely                            healed.                           Diffuse mild inflammation characterized by                            congestion (edema) and erythema was found in the  entire examined stomach. Biopsies were taken with a                            cold forceps for histology using gastric mapping                            [place] protocol (Bx from antrum-LC, GC, body-LC,GC                            and fundus/ cardia sent in separate jars)                           The examined duodenum was normal. Complications:            No immediate complications. Estimated Blood Loss:     Estimated blood loss: none. Impression:               - Benign-appearing esophageal stenosis. Dilated.                           - Gastritis. Biopsied.                           - Normal examined duodenum. Recommendation:           - Patient has a contact number available for                            emergencies. The signs and symptoms of potential                            delayed complications were discussed with the                            patient. Return to normal activities tomorrow.                            Written discharge instructions were  provided to the                            patient.                           - Post dil diet.                           - Continue Protonix  40 mg p.o. daily                           - Await pathology results.                           - Avoid nonsteroidals/alcohol.                           - FU If any problems.                           -  The findings and recommendations were discussed                            with the patient's family. Lynnie Bring, MD 04/04/2024 9:51:04 AM This report has been signed electronically.

## 2024-04-04 NOTE — Progress Notes (Signed)
 "   Chief Complaint:hospital follow-up Primary GI Doctor:(previously Dr. Teressa) Dr. Charlanne   HPI:  James Pratt is a 54 y.o. male with past medical history significant for remote history of alcohol use (no longer drinking), left eye blindness, diabetes, chronic GERD, hyperlipidemia, presents for hospital follow-up.   01/02/24 GI consulted at hospital for evaluation of coffee-ground emesis.  CT abdomen pelvis with contrast significant for distal esophageal wall thickening concerning for reflux/esophagitis, otherwise unrevealing. HD stable. Hb stable. GERD. No dysphagia H/O remote EtOH use.  No cirrhosis on CT Normal CBC/CMP without elevated liver enzymes or leukocytosis.     Interval History Patient presents for follow-up after recent hospitalization for coffee ground emesis and GERD with esophagitis grade C.  Patient reports he has been taking PPI therapy twice daily. Reports he is no longer having GERD symptoms. No dysphagia. No hematemesis No blood in stool.   He has occasional constipation. He does strain sometimes. He does note some irritation at rectum sometimes after bowel movement. Describes it as a burning sensation.    He does complain of left hip pain and lower back pain.    No alcohol use. He stopped smoking mariajuana.     Patient's family history includes:  no colon CA, no esophageal CA   PREVIOUS GI WORKUP    01/02/24 EGD:LA Grade C reflux esophagitis with no bleeding. Biopsied. Gastritis. Biopsied. Normal examined duodenum.     Screening colonoscopy in 2021 - Normal - Repeat 10 years      Wt Readings from Last 3 Encounters:  02/23/24 161 lb (73 kg)  01/01/24 154 lb 1.6 oz (69.9 kg)  09/21/23 165 lb (74.8 kg)          Past Medical History:  Diagnosis Date   Bilateral knee pain 12/29/2017   Blind left eye     Cataract      forming right eye    Contusion of toenail 05/08/2017   Depression      situational after death of brother    Diabetes  mellitus without complication (HCC)     GERD (gastroesophageal reflux disease)      past hx of GERD- denies currently   Hyperlipidemia     Orthostatic hypotension 10/03/2016               Past Surgical History:  Procedure Laterality Date   ESOPHAGOGASTRODUODENOSCOPY N/A 01/02/2024    Procedure: EGD (ESOPHAGOGASTRODUODENOSCOPY);  Surgeon: Charlanne Groom, MD;  Location: [facility];  Service: Gastroenterology;  Laterality: N/A;   EYE SURGERY Left                  Current Outpatient Medications  Medication Sig Dispense Refill   acetaminophen  (TYLENOL ) 500 MG tablet Take 1,000 mg by mouth every 6 (six) hours as needed for moderate pain.       glucose blood (ONE TOUCH ULTRA TEST) test strip Dispense QS for twice daily testing 100 each 12   pantoprazole  (PROTONIX ) 40 MG tablet Take 1 tablet (40 mg total) by mouth 2 (two) times daily. 60 tablet 2   SYNJARDY XR 12.07-998 MG TB24 Take 1 tablet by mouth daily.          No current facility-administered medications for this visit.             Allergies as of 02/23/2024 - Review Complete 02/23/2024  Allergen Reaction Noted   Ezetimibe Other (See Comments) 11/23/2021   Meloxicam  Tinitus 10/06/2016  Family History  Problem Relation Age of Onset   Colon cancer Neg Hx     Colon polyps Neg Hx     Esophageal cancer Neg Hx     Rectal cancer Neg Hx     Stomach cancer Neg Hx            Review of Systems:    Constitutional: No weight loss, fever, chills, weakness or fatigue HEENT: Eyes: No change in vision               Ears, Nose, Throat:  No change in hearing or congestion Skin: No rash or itching Cardiovascular: No chest pain, chest pressure or palpitations   Respiratory: No SOB or cough Gastrointestinal: See HPI and otherwise negative Genitourinary: No dysuria or change in urinary frequency Neurological: No headache, dizziness or syncope Musculoskeletal: No new muscle or joint pain Hematologic: No bleeding or  bruising Psychiatric: No history of depression or anxiety      Physical Exam:  Vital signs: BP 110/70   Pulse 80   Ht 5' 8 (1.727 m)   Wt 161 lb (73 kg)   BMI 24.48 kg/m    Constitutional:   Pleasant male appears to be in NAD, Well developed, Well nourished, alert and cooperative Eyes:   PEERL, EOMI. No icterus. Conjunctiva pink. Neck:  Supple Throat: Oral cavity and pharynx without inflammation, swelling or lesion.  Respiratory: Respirations even and unlabored. Lungs clear to auscultation bilaterally.   No wheezes, crackles, or rhonchi.  Cardiovascular: Normal S1, S2. Regular rate and rhythm. No peripheral edema, cyanosis or pallor.  Gastrointestinal:  Soft, nondistended, nontender. No rebound or guarding. Normal bowel sounds. No appreciable masses or hepatomegaly. Rectal:  Not performed.  Msk:  Symmetrical without gross deformities. Without edema, no deformity or joint abnormality.  Neurologic:  Alert and  oriented x4;  grossly normal neurologically.  Skin:   Dry and intact without significant lesions or rashes.   RELEVANT LABS AND IMAGING: CBC     Latest Ref Rng & Units 01/03/2024    4:21 AM 01/02/2024   11:19 PM 01/02/2024    5:29 PM  CBC  WBC 4.0 - 10.5 K/uL 8.0  6.4  4.5   Hemoglobin 13.0 - 17.0 g/dL 86.4  86.8  86.5   Hematocrit 39.0 - 52.0 % 40.1  39.8  40.5   Platelets 150 - 400 K/uL 144  150  143       CMP         Latest Ref Rng & Units 01/02/2024    4:12 AM 01/01/2024    2:18 PM 04/08/2023   11:14 AM  CMP  Glucose 70 - 99 mg/dL 85  877  872   BUN 6 - 20 mg/dL 17  18  15    Creatinine 0.61 - 1.24 mg/dL 9.09  9.08  9.27   Sodium 135 - 145 mmol/L 140  140  138   Potassium 3.5 - 5.1 mmol/L 3.9  4.0  4.0   Chloride 98 - 111 mmol/L 105  99  105   CO2 22 - 32 mmol/L 25  28  25    Calcium  8.9 - 10.3 mg/dL 9.2  89.4  9.6   Total Protein 6.5 - 8.1 g/dL 6.5  8.0  7.4   Total Bilirubin 0.0 - 1.2 mg/dL 0.6  0.5  0.6   Alkaline Phos 38 - 126 U/L 51  65  61   AST 15  - 41 U/L 25  37  19  ALT 0 - 44 U/L 15  21  12      04/2019 echo- Left ventricular ejection fraction, by estimation, is 65 to 70%.    01/01/24 CTAP IMPRESSION: 1. Distal esophageal wall thickening, can be seen with reflux or esophagitis. 2. No other acute abnormality in the abdomen/pelvis.     Assessment/Plan:     Encounter Diagnoses  Name Primary?   Gastroesophageal reflux disease with esophagitis, unspecified whether hemorrhage Yes   Hematemesis, unspecified whether nausea present     Thrombocytopenia      Patient presents for follow-up ED visit where he had nausea, vomiting and hematemesis x 2. Previous history of alcohol use disorder, now remission. Also endorses occasional NSAID use, but not excessive. Not on anticoagulant/antiplatelet. Hemoglobin 14.3 on admission. CT abdomen/pelvis with distal esophageal wall thickening consistent with reflux versus esophagitis without any further abnormality in the abdomen/pelvis.    #1 Hematemesis with concern for UGIB #2 Esophageal wall thickening #3 GERD   -Avoid ibuprofen , naproxen , or other non- steroidal anti- inflammatory drugs. -Protonix  40 mg p. o. twice daily x 6 weeks, then every day -recheck CBC  -Schedule follow-up EGD in [facility] with Dr. Charlanne. The risks and benefits of EGD with possible biopsies and esophageal dilation were discussed with the patient who agrees to proceed.   #4 Thrombocytopenia   #5 DM2    #6 Left eye blindness -Supportive care   #7 History of EtOH use disorder, now in remission -Continue to encourage complete abstinence/cessation.   #8 Left hip pain -Referral to Dr. [facility]     Thank you for the courtesy of this consult. Please call me with any questions or concerns.    Deanna May, FNP-C      Attending physician's note   I have taken history, reviewed the chart and examined the patient. I performed a substantive portion of this encounter, including complete performance of  at least one of the key components, in conjunction with the APP. I agree with the Advanced Practitioner's note, impression and recommendations.    Anselm Charlanne, MD [facility] [phone]  "

## 2024-04-05 ENCOUNTER — Telehealth: Payer: Self-pay | Admitting: *Deleted

## 2024-04-05 NOTE — Telephone Encounter (Signed)
" °  Follow up Call-     04/04/2024    8:52 AM  Call back number  Post procedure Call Back phone  # [phone]  Permission to leave phone message Yes     Patient questions:   Message left to call if necessary. "

## 2024-04-08 LAB — SURGICAL PATHOLOGY
